# Patient Record
Sex: Male | Born: 1935 | Race: White | Hispanic: No | Smoking: Former smoker
Health system: Southern US, Community
[De-identification: ages and names within clinical notes are randomized; demographics above are authoritative.]

## PROBLEM LIST (undated history)

## (undated) DIAGNOSIS — J189 Pneumonia, unspecified organism: Secondary | ICD-10-CM

## (undated) DIAGNOSIS — R7303 Prediabetes: Secondary | ICD-10-CM

## (undated) DIAGNOSIS — K922 Gastrointestinal hemorrhage, unspecified: Secondary | ICD-10-CM

## (undated) DIAGNOSIS — I2510 Atherosclerotic heart disease of native coronary artery without angina pectoris: Secondary | ICD-10-CM

## (undated) DIAGNOSIS — A159 Respiratory tuberculosis unspecified: Secondary | ICD-10-CM

## (undated) DIAGNOSIS — M7551 Bursitis of right shoulder: Secondary | ICD-10-CM

## (undated) DIAGNOSIS — I10 Essential (primary) hypertension: Secondary | ICD-10-CM

## (undated) DIAGNOSIS — D649 Anemia, unspecified: Secondary | ICD-10-CM

## (undated) DIAGNOSIS — Z91199 Patient's noncompliance with other medical treatment and regimen due to unspecified reason: Secondary | ICD-10-CM

## (undated) DIAGNOSIS — E785 Hyperlipidemia, unspecified: Secondary | ICD-10-CM

## (undated) DIAGNOSIS — Z9119 Patient's noncompliance with other medical treatment and regimen: Secondary | ICD-10-CM

## (undated) DIAGNOSIS — M7552 Bursitis of left shoulder: Secondary | ICD-10-CM

## (undated) HISTORY — PX: UPPER GI ENDOSCOPY: SHX6162

## (undated) HISTORY — PX: TONSILLECTOMY: SUR1361

## (undated) HISTORY — PX: CORONARY ANGIOPLASTY WITH STENT PLACEMENT: SHX49

## (undated) HISTORY — PX: CATARACT EXTRACTION W/ INTRAOCULAR LENS  IMPLANT, BILATERAL: SHX1307

---

## 2007-09-08 ENCOUNTER — Ambulatory Visit: Payer: Self-pay | Admitting: Ophthalmology

## 2007-09-08 ENCOUNTER — Other Ambulatory Visit: Payer: Self-pay

## 2007-09-09 ENCOUNTER — Ambulatory Visit: Payer: Self-pay | Admitting: Cardiology

## 2007-09-14 ENCOUNTER — Ambulatory Visit: Payer: Self-pay | Admitting: Ophthalmology

## 2007-09-30 ENCOUNTER — Ambulatory Visit: Payer: Self-pay | Admitting: Ophthalmology

## 2007-10-05 ENCOUNTER — Ambulatory Visit: Payer: Self-pay | Admitting: Ophthalmology

## 2013-10-28 DIAGNOSIS — K922 Gastrointestinal hemorrhage, unspecified: Secondary | ICD-10-CM

## 2013-10-28 HISTORY — DX: Gastrointestinal hemorrhage, unspecified: K92.2

## 2015-03-24 ENCOUNTER — Other Ambulatory Visit: Payer: Self-pay

## 2015-03-24 ENCOUNTER — Encounter: Payer: Self-pay | Admitting: Emergency Medicine

## 2015-03-24 ENCOUNTER — Emergency Department: Payer: Medicare Other

## 2015-03-24 ENCOUNTER — Emergency Department
Admission: EM | Admit: 2015-03-24 | Discharge: 2015-03-24 | Disposition: A | Discharge status: Home / Self Care | Payer: Medicare Other | Attending: Emergency Medicine | Admitting: Emergency Medicine

## 2015-03-24 DIAGNOSIS — Z87891 Personal history of nicotine dependence: Secondary | ICD-10-CM | POA: Insufficient documentation

## 2015-03-24 DIAGNOSIS — R42 Dizziness and giddiness: Secondary | ICD-10-CM | POA: Diagnosis present

## 2015-03-24 DIAGNOSIS — Z880 Allergy status to penicillin: Secondary | ICD-10-CM | POA: Insufficient documentation

## 2015-03-24 DIAGNOSIS — Z79899 Other long term (current) drug therapy: Secondary | ICD-10-CM | POA: Insufficient documentation

## 2015-03-24 DIAGNOSIS — Z7982 Long term (current) use of aspirin: Secondary | ICD-10-CM | POA: Diagnosis not present

## 2015-03-24 DIAGNOSIS — Z7951 Long term (current) use of inhaled steroids: Secondary | ICD-10-CM | POA: Diagnosis not present

## 2015-03-24 LAB — CBC
HEMATOCRIT: 33.6 % — AB (ref 40.0–52.0)
Hemoglobin: 10.3 g/dL — ABNORMAL LOW (ref 13.0–18.0)
MCH: 22.9 pg — ABNORMAL LOW (ref 26.0–34.0)
MCHC: 30.6 g/dL — AB (ref 32.0–36.0)
MCV: 74.8 fL — ABNORMAL LOW (ref 80.0–100.0)
Platelets: 414 10*3/uL (ref 150–440)
RBC: 4.49 MIL/uL (ref 4.40–5.90)
RDW: 23.8 % — ABNORMAL HIGH (ref 11.5–14.5)
WBC: 7.3 10*3/uL (ref 3.8–10.6)

## 2015-03-24 LAB — BASIC METABOLIC PANEL
ANION GAP: 8 (ref 5–15)
BUN: 12 mg/dL (ref 6–20)
CALCIUM: 8.7 mg/dL — AB (ref 8.9–10.3)
CO2: 26 mmol/L (ref 22–32)
Chloride: 104 mmol/L (ref 101–111)
Creatinine, Ser: 0.98 mg/dL (ref 0.61–1.24)
GFR calc non Af Amer: >60 mL/min (ref >60)
Glucose, Bld: 123 mg/dL — ABNORMAL HIGH (ref 65–99)
Potassium: 3.1 mmol/L — ABNORMAL LOW (ref 3.5–5.1)
SODIUM: 138 mmol/L (ref 135–145)

## 2015-03-24 LAB — TROPONIN I: Troponin I: <0.03 ng/mL (ref <0.031)

## 2015-03-24 MED ORDER — MECLIZINE HCL 25 MG PO TABS
25.0000 mg | ORAL_TABLET | Freq: Once | ORAL | Status: AC
  Administered 2015-03-24: 25 mg via ORAL

## 2015-03-24 MED ORDER — MECLIZINE HCL 12.5 MG PO TABS: 12.5000 mg | ORAL_TABLET | Freq: Three times a day (TID) | ORAL | Status: AC | PRN

## 2015-03-24 MED ORDER — MECLIZINE HCL 25 MG PO TABS
ORAL_TABLET | ORAL | Status: AC
  Administered 2015-03-24: 25 mg via ORAL
  Filled 2015-03-24: qty 1

## 2015-03-24 MED ORDER — SODIUM CHLORIDE 0.9 % IV BOLUS (SEPSIS)
500.0000 mL | Freq: Once | INTRAVENOUS | Status: AC
  Administered 2015-03-24: 500 mL via INTRAVENOUS

## 2015-03-24 NOTE — ED Notes (Signed)
Attempt made to insert IV, patient refused to allow me to try again.

## 2015-03-24 NOTE — ED Notes (Signed)
Patient to ER for dizziness and weakness that started upon waking this am.

## 2015-03-24 NOTE — ED Provider Notes (Signed)
[facility] Emergency Department Provider Note  ____________________________________________  Time seen:    I have reviewed the triage vital signs and the nursing notes.   HISTORY  Chief Complaint Dizziness and Weakness     HPI Allen Terry is a 79 y.o. male who woke at approximately 3 in the morning. He got up to the bathroom and felt off balance and had difficulty walking. He returned to bed but found that he had further feelings of being off balance upon waking this morning. He feels the room is shifting or spinning distal little bit. He feels off balance. His symptoms are moderate.  He does not have any headache, chest pain, or shortness of breath. He has no focal weakness. He does report having vertigo in the past. This is mostly similar although the patient is concerned that he might be having a stroke.  He does have a notable cardiac history with 2 stents in his coronary arteries.  Patient does report that he has a bleeding ulcer that he is being treated for. He had a recent drop in his blood count and is currently on iron pills. He does have dark stools.   History reviewed. No pertinent past medical history.  There are no active problems to display for this patient.   Past Surgical History  Procedure Laterality Date  . Cardiac surgery      Stent    Current Outpatient Rx  Name  Route  Sig  Dispense  Refill  . aspirin EC 81 MG tablet   Oral   Take 81 mg by mouth daily.         . ferrous sulfate 325 (65 FE) MG tablet   Oral   Take 325 mg by mouth 2 (two) times daily.         . finasteride (PROSCAR) 5 MG tablet   Oral   Take 5 mg by mouth daily.         . fluticasone (FLONASE) 50 MCG/ACT nasal spray   Each Nare   Place 1 spray into both nostrils daily.         Marland Kitchen gabapentin (NEURONTIN) 300 MG capsule   Oral   Take 300 mg by mouth daily.         . hydrochlorothiazide (HYDRODIURIL) 25 MG tablet   Oral   Take 25 mg by  mouth daily.         Marland Kitchen omeprazole (PRILOSEC OTC) 20 MG tablet   Oral   Take 20 mg by mouth 2 (two) times daily.         . prasugrel (EFFIENT) 10 MG TABS tablet   Oral   Take 10 mg by mouth daily.         . tamsulosin (FLOMAX) 0.4 MG CAPS capsule   Oral   Take 0.4 mg by mouth daily.         . meclizine (ANTIVERT) 12.5 MG tablet   Oral   Take 1 tablet (12.5 mg total) by mouth 3 (three) times daily as needed for dizziness or nausea.   16 tablet   1     Allergies Penicillins  No family history on file.  Social History History  Substance Use Topics  . Smoking status: Former Games developer  . Smokeless tobacco: Not on file  . Alcohol Use: No    Review of Systems  Constitutional: Negative for fever. ENT: Negative for sore throat. Cardiovascular: Negative for chest pain. Respiratory: Negative for shortness of breath. Gastrointestinal:  Negative for abdominal pain, vomiting and diarrhea. Notable for history of bleeding also. The patient recently had a skin been drop is his hemoglobin level and is now on iron pills. Patient reports he has dark stools. Genitourinary: Negative for dysuria. Musculoskeletal: Negative for back pain. Skin: Negative for rash. Neurological: Notable for vertigo. See history of present illness  10-point ROS otherwise negative.  ____________________________________________   PHYSICAL EXAM:  VITAL SIGNS: ED Triage Vitals  Enc Vitals Group     BP 03/24/15 1113 129/64 mmHg     Pulse Rate 03/24/15 1113 64     Resp 03/24/15 1113 18     Temp 03/24/15 1113 97.5 F (36.4 C)     Temp Source 03/24/15 1113 Oral     SpO2 03/24/15 1113 96 %     Weight 03/24/15 1113 200 lb (90.719 kg)     Height 03/24/15 1113 5\' 8"  (1.727 m)     Head Cir --      Peak Flow --      Pain Score 03/24/15 1110 5     Pain Loc --      Pain Edu? --      Excl. in GC? --     Constitutional:  Alert and oriented. Well appearing and in no distress. Notable tremor.Marland Kitchen. ENT    Head: Normocephalic and atraumatic.   Nose: No congestion/rhinnorhea.   Mouth/Throat: Mucous membranes are moist.      Ears: Normal canals, normal TMs. Cardiovascular: Normal rate, regular rhythm. Respiratory: Normal respiratory effort without tachypnea. Breath sounds are clear and equal bilaterally. No wheezes/rales/rhonchi. Gastrointestinal: Soft and nontender. No distention.  Back: No muscle spasm, no tenderness, no CVA tenderness. Musculoskeletal: Nontender with normal range of motion in all extremities.  No noted edema. Neurologic: Alert and oriented. 5 over 5 strength in all 4 extremities. Equal grip strength. Good finger to nose and heel to shin test. Negative pronator drift. Romberg is done with the patient seated in bed but was negative. Cranial nerves II through XII are intact..  Skin:  Skin is warm, dry. No rash noted. Psychiatric: Mood and affect are normal. Speech and behavior are normal.  ____________________________________________    LABS (pertinent positives/negatives)  CBC 7.3 hemoglobin of 10.3 which is better than the patient's last reading per the patient. Metabolic panel shows a slightly low potassium at 3.1 otherwise normal. Troponin is negative  ____________________________________________   EKG  ED ECG REPORT I, Tranice Laduke W, the attending physician, personally viewed and interpreted this ECG.   Date: 03/24/2015  EKG Time: 11:30  Rate: 65  Rhythm: Normal sinus rhythm  Axis: Normal  Intervals: Normal  ST&T Change: None   ____________________________________________    RADIOLOGY  CT head: IMPRESSION: Mild atrophic changes. No acute abnormality noted.  ____________________________________________  INITIAL IMPRESSION / ASSESSMENT AND PLAN / ED COURSE  Patient has a mild tremor which is not new. I believe he has peripheral vertigo. Neurologically he appears intact. We will do a head CT. I will treat the likely peripheral vertigo with  meclizine.  ----------------------------------------- 5:03 PM on 03/24/2015 -----------------------------------------   Patient feels much better. He is now able to stand up and walk. He no longer has acute symptoms. His head CT was unremarkable except for expected age-related changes. I will prescribe meclizine  ____________________________________________   FINAL CLINICAL IMPRESSION(S) / ED DIAGNOSES  Final diagnoses:  Vertigo      Darien Ramusavid W Zenaida Tesar, MD 03/24/15 [phone]

## 2015-03-24 NOTE — Discharge Instructions (Signed)

## 2016-02-12 ENCOUNTER — Emergency Department (HOSPITAL_COMMUNITY): Payer: Medicare Other

## 2016-02-12 ENCOUNTER — Inpatient Hospital Stay (HOSPITAL_COMMUNITY)
Admission: EM | Admit: 2016-02-12 | Discharge: 2016-02-14 | DRG: 247 | Disposition: A | Discharge status: Home / Self Care | Payer: Medicare Other | Attending: Cardiovascular Disease | Admitting: Cardiovascular Disease

## 2016-02-12 ENCOUNTER — Encounter (HOSPITAL_COMMUNITY): Payer: Self-pay | Admitting: Emergency Medicine

## 2016-02-12 DIAGNOSIS — I25110 Atherosclerotic heart disease of native coronary artery with unstable angina pectoris: Principal | ICD-10-CM

## 2016-02-12 DIAGNOSIS — I200 Unstable angina: Secondary | ICD-10-CM | POA: Diagnosis not present

## 2016-02-12 DIAGNOSIS — I2510 Atherosclerotic heart disease of native coronary artery without angina pectoris: Secondary | ICD-10-CM | POA: Insufficient documentation

## 2016-02-12 DIAGNOSIS — I10 Essential (primary) hypertension: Secondary | ICD-10-CM | POA: Diagnosis not present

## 2016-02-12 DIAGNOSIS — Z7982 Long term (current) use of aspirin: Secondary | ICD-10-CM

## 2016-02-12 DIAGNOSIS — R0789 Other chest pain: Secondary | ICD-10-CM | POA: Diagnosis present

## 2016-02-12 DIAGNOSIS — E785 Hyperlipidemia, unspecified: Secondary | ICD-10-CM | POA: Diagnosis not present

## 2016-02-12 DIAGNOSIS — Z880 Allergy status to penicillin: Secondary | ICD-10-CM

## 2016-02-12 DIAGNOSIS — Z955 Presence of coronary angioplasty implant and graft: Secondary | ICD-10-CM

## 2016-02-12 DIAGNOSIS — Z79899 Other long term (current) drug therapy: Secondary | ICD-10-CM

## 2016-02-12 DIAGNOSIS — R0609 Other forms of dyspnea: Secondary | ICD-10-CM

## 2016-02-12 DIAGNOSIS — Z888 Allergy status to other drugs, medicaments and biological substances status: Secondary | ICD-10-CM

## 2016-02-12 DIAGNOSIS — Z9114 Patient's other noncompliance with medication regimen: Secondary | ICD-10-CM

## 2016-02-12 HISTORY — DX: Gastrointestinal hemorrhage, unspecified: K92.2

## 2016-02-12 HISTORY — DX: Anemia, unspecified: D64.9

## 2016-02-12 HISTORY — DX: Bursitis of left shoulder: M75.52

## 2016-02-12 HISTORY — DX: Hyperlipidemia, unspecified: E78.5

## 2016-02-12 HISTORY — DX: Bursitis of right shoulder: M75.51

## 2016-02-12 HISTORY — DX: Atherosclerotic heart disease of native coronary artery without angina pectoris: I25.10

## 2016-02-12 HISTORY — DX: Pneumonia, unspecified organism: J18.9

## 2016-02-12 HISTORY — DX: Essential (primary) hypertension: I10

## 2016-02-12 HISTORY — DX: Respiratory tuberculosis unspecified: A15.9

## 2016-02-12 LAB — BASIC METABOLIC PANEL
ANION GAP: 11 (ref 5–15)
BUN: 7 mg/dL (ref 6–20)
CALCIUM: 9.3 mg/dL (ref 8.9–10.3)
CO2: 24 mmol/L (ref 22–32)
CREATININE: 1.09 mg/dL (ref 0.61–1.24)
Chloride: 103 mmol/L (ref 101–111)
Glucose, Bld: 122 mg/dL — ABNORMAL HIGH (ref 65–99)
Potassium: 3.4 mmol/L — ABNORMAL LOW (ref 3.5–5.1)
SODIUM: 138 mmol/L (ref 135–145)

## 2016-02-12 LAB — CBC
HCT: 44.3 % (ref 39.0–52.0)
Hemoglobin: 14.6 g/dL (ref 13.0–17.0)
MCH: 27.5 pg (ref 26.0–34.0)
MCHC: 33 g/dL (ref 30.0–36.0)
MCV: 83.6 fL (ref 78.0–100.0)
PLATELETS: 295 10*3/uL (ref 150–400)
RBC: 5.3 MIL/uL (ref 4.22–5.81)
RDW: 14.1 % (ref 11.5–15.5)
WBC: 8.6 10*3/uL (ref 4.0–10.5)

## 2016-02-12 LAB — BRAIN NATRIURETIC PEPTIDE: B Natriuretic Peptide: 154.1 pg/mL — ABNORMAL HIGH (ref 0.0–100.0)

## 2016-02-12 LAB — PROTIME-INR
INR: 1.12 (ref 0.00–1.49)
Prothrombin Time: 14.6 seconds (ref 11.6–15.2)

## 2016-02-12 LAB — I-STAT TROPONIN, ED: TROPONIN I, POC: 0.01 ng/mL (ref 0.00–0.08)

## 2016-02-12 LAB — TROPONIN I: Troponin I: <0.03 ng/mL (ref <0.031)

## 2016-02-12 MED ORDER — SODIUM CHLORIDE 0.9 % WEIGHT BASED INFUSION
1.0000 mL/kg/h | INTRAVENOUS | Status: DC
  Administered 2016-02-13: 1 mL/kg/h via INTRAVENOUS

## 2016-02-12 MED ORDER — SODIUM CHLORIDE 0.9% FLUSH: 3.0000 mL | INTRAVENOUS | Status: DC | PRN

## 2016-02-12 MED ORDER — MECLIZINE HCL 12.5 MG PO TABS
12.5000 mg | ORAL_TABLET | Freq: Three times a day (TID) | ORAL | Status: DC | PRN
  Filled 2016-02-12: qty 1

## 2016-02-12 MED ORDER — ACETAMINOPHEN 325 MG PO TABS: 650.0000 mg | ORAL_TABLET | ORAL | Status: DC | PRN

## 2016-02-12 MED ORDER — TAMSULOSIN HCL 0.4 MG PO CAPS
0.4000 mg | ORAL_CAPSULE | Freq: Every day | ORAL | Status: DC
  Administered 2016-02-13 – 2016-02-14 (×2): 0.4 mg via ORAL
  Filled 2016-02-12 (×2): qty 1

## 2016-02-12 MED ORDER — HEPARIN BOLUS VIA INFUSION
4000.0000 [IU] | Freq: Once | INTRAVENOUS | Status: AC
  Administered 2016-02-12: 4000 [IU] via INTRAVENOUS
  Filled 2016-02-12: qty 4000

## 2016-02-12 MED ORDER — NITROGLYCERIN 0.4 MG SL SUBL: 0.4000 mg | SUBLINGUAL_TABLET | SUBLINGUAL | Status: DC | PRN

## 2016-02-12 MED ORDER — ASPIRIN EC 81 MG PO TBEC
81.0000 mg | DELAYED_RELEASE_TABLET | Freq: Every evening | ORAL | Status: DC
  Administered 2016-02-12 – 2016-02-14 (×2): 81 mg via ORAL
  Filled 2016-02-12 (×2): qty 1

## 2016-02-12 MED ORDER — SODIUM CHLORIDE 0.9% FLUSH
3.0000 mL | Freq: Two times a day (BID) | INTRAVENOUS | Status: DC
  Administered 2016-02-12: 3 mL via INTRAVENOUS

## 2016-02-12 MED ORDER — HEPARIN (PORCINE) IN NACL 100-0.45 UNIT/ML-% IJ SOLN
1150.0000 [IU]/h | INTRAMUSCULAR | Status: DC
  Administered 2016-02-12: 1150 [IU]/h via INTRAVENOUS
  Filled 2016-02-12: qty 250

## 2016-02-12 MED ORDER — ASPIRIN 81 MG PO CHEW
81.0000 mg | CHEWABLE_TABLET | ORAL | Status: AC
  Administered 2016-02-13: 81 mg via ORAL
  Filled 2016-02-12: qty 1

## 2016-02-12 MED ORDER — SODIUM CHLORIDE 0.9 % WEIGHT BASED INFUSION
3.0000 mL/kg/h | INTRAVENOUS | Status: DC
  Administered 2016-02-13: 3 mL/kg/h via INTRAVENOUS

## 2016-02-12 MED ORDER — ONDANSETRON HCL 4 MG/2ML IJ SOLN: 4.0000 mg | Freq: Four times a day (QID) | INTRAMUSCULAR | Status: DC | PRN

## 2016-02-12 MED ORDER — SODIUM CHLORIDE 0.9 % IV SOLN: 250.0000 mL | INTRAVENOUS | Status: DC | PRN

## 2016-02-12 NOTE — ED Notes (Signed)
Ordered diet tray 

## 2016-02-12 NOTE — ED Provider Notes (Signed)
Patient signed out to me at shift change by Gaylyn RongSamantha Dowless, PA-C.  [age] year old male with a PMH of CAD s/p stent placement presents with chest pressure and progressively worsening dyspnea on exertion x 2 weeks. States he has not been able to tolerate his usual activity due to this. Reports this is how he felt in the past when he had blockages.   Patient is afebrile. Vital signs stable.   EKG Interpretation  Date/Time:  Monday February 12 2016 12:28:54 EDT Ventricular Rate:  80 PR Interval:  150 QRS Duration: 88 QT Interval:  380 QTC Calculation: 438 R Axis:   52 Text Interpretation:  Normal sinus rhythm Nonspecific ST abnormality Abnormal ECG Confirmed by Fayrene FearingJAMES  MD, MARK ([id number]) on 02/12/2016 2:33:08 PM      EKG sinus rhythm, nonspecific ST abnormality, HR 80. Troponin negative. CBC negative for leukocytosis or anemia. BMP remarkable for potassium 3.4. INR 1.12. CXR negative for active cardiopulmonary disease.  Cardiology consulted. Plan to follow-up recommendation. Per cardiology note, patient to be admitted and will undergo cardiac cath in the AM.  BP 111/54 mmHg  Pulse 70  Temp(Src) 98 F (36.7 C) (Oral)  Resp 16  SpO2 93%    Mady Gemmalizabeth C Westfall, PA-C 02/12/16 1802  Rolland PorterMark James, MD 02/20/16 1456

## 2016-02-12 NOTE — ED Provider Notes (Signed)
CSN: [id number]     Arrival date & time 02/12/16  1210 History   First MD Initiated Contact with Patient 02/12/16 1416     Chief Complaint  Patient presents with  . Chest Pain     (Consider location/radiation/quality/duration/timing/severity/associated sxs/prior Treatment) HPI   Allen Terry is a 80 y.o M with a pmhx of CAD s/p multiple stent placements, HTN, HLD who presents to the ED c/o chest pressure and DOE. Pt states that he is typically walks 2 miles daily but over the last 2 weeks he has only been able to walk 1-2 blocks without becoming very short of breath. Pt states that this morning he could barely walk to the bathroom. He also reports that his chest "feels full". Pt states "I know I have blockage and I want a catheterization." Pt states that this is how he felt when he had his last blockages. Pt denies dizziness, diaphoresis, LOC, abd pain. [facility] and had his stents placed there.    History reviewed. No pertinent past medical history. Past Surgical History  Procedure Laterality Date  . Cardiac surgery      Stent  . Stents     No family history on file. Social History  Substance Use Topics  . Smoking status: Former Games developermoker  . Smokeless tobacco: None  . Alcohol Use: No    Review of Systems  All other systems reviewed and are negative.     Allergies  Penicillins  Home Medications   Prior to Admission medications   Medication Sig Start Date End Date Taking? Authorizing Provider  aspirin EC 81 MG tablet Take 81 mg by mouth daily.    Historical Provider, MD  ferrous sulfate 325 (65 FE) MG tablet Take 325 mg by mouth 2 (two) times daily.    Historical Provider, MD  finasteride (PROSCAR) 5 MG tablet Take 5 mg by mouth daily.    Historical Provider, MD  fluticasone (FLONASE) 50 MCG/ACT nasal spray Place 1 spray into both nostrils daily.    Historical Provider, MD  gabapentin (NEURONTIN) 300 MG capsule Take 300 mg by mouth  daily.    Historical Provider, MD  hydrochlorothiazide (HYDRODIURIL) 25 MG tablet Take 25 mg by mouth daily.    Historical Provider, MD  meclizine (ANTIVERT) 12.5 MG tablet Take 1 tablet (12.5 mg total) by mouth 3 (three) times daily as needed for dizziness or nausea. 03/24/15 03/23/16  Darien Ramusavid W Kaminski, MD  omeprazole (PRILOSEC OTC) 20 MG tablet Take 20 mg by mouth 2 (two) times daily.    Historical Provider, MD  prasugrel (EFFIENT) 10 MG TABS tablet Take 10 mg by mouth daily.    Historical Provider, MD  tamsulosin (FLOMAX) 0.4 MG CAPS capsule Take 0.4 mg by mouth daily.    Historical Provider, MD   BP 165/72 mmHg  Pulse 67  Temp(Src) 98 F (36.7 C) (Oral)  Resp 17  SpO2 97% Physical Exam  Constitutional: He is oriented to person, place, and time. He appears well-developed and well-nourished. No distress.  HENT:  Head: Normocephalic and atraumatic.  Mouth/Throat: No oropharyngeal exudate.  Eyes: Conjunctivae and EOM are normal. Pupils are equal, round, and reactive to light. Right eye exhibits no discharge. Left eye exhibits no discharge. No scleral icterus.  Cardiovascular: Normal rate, regular rhythm, normal heart sounds and intact distal pulses.  Exam reveals no gallop and no friction rub.   No murmur heard. Pulmonary/Chest: Effort normal and breath sounds normal. No  respiratory distress. He has no wheezes. He has no rales. He exhibits no tenderness.  Abdominal: Soft. He exhibits no distension. There is no tenderness. There is no guarding.  Musculoskeletal: Normal range of motion. He exhibits no edema.  Neurological: He is alert and oriented to person, place, and time.  Strength 5/5 throughout. No sensory deficits.    Skin: Skin is warm and dry. No rash noted. He is not diaphoretic. No erythema. No pallor.  Psychiatric: He has a normal mood and affect. His behavior is normal.  Nursing note and vitals reviewed.   ED Course  Procedures (including critical care time) Labs  Review Labs Reviewed  BASIC METABOLIC PANEL - Abnormal; Notable for the following:    Potassium 3.4 (*)    Glucose, Bld 122 (*)    All other components within normal limits  CBC  PROTIME-INR  BRAIN NATRIURETIC PEPTIDE  I-STAT TROPOININ, ED    Imaging Review Dg Chest 2 View  02/12/2016  CLINICAL DATA:  Chest pain and shortness of breath. EXAM: CHEST  2 VIEW COMPARISON:  None. FINDINGS: The heart size and mediastinal contours are within normal limits. Both lungs are clear. The visualized skeletal structures are unremarkable. IMPRESSION: No active cardiopulmonary disease. Electronically Signed   By: Francene Boyers M.D.   On: 02/12/2016 13:05   I have personally reviewed and evaluated these images and lab results as part of my medical decision-making.   EKG Interpretation   Date/Time:  Monday February 12 2016 12:28:54 EDT Ventricular Rate:  80 PR Interval:  150 QRS Duration: 88 QT Interval:  380 QTC Calculation: 438 R Axis:   52 Text Interpretation:  Normal sinus rhythm Nonspecific ST abnormality  Abnormal ECG Confirmed by Fayrene Fearing  MD, MARK ([id number]) on 02/12/2016 2:33:08 PM      MDM   Final diagnoses:  DOE (dyspnea on exertion)    80 y.o M with HTN, CAD, HLD presents to the ED c/o DOE onset 2 weeks ago. This is drastically different from his baseline. Normally able to walk 2 miles daily, this morning was unable to walk to bathroom. On presentation to ED, pt appears well, in NAD. Pt states that this feels like the last time he had a blockage requiring stent placement. EKG reveals nonspecific T wave abnormality. Initial troponin normal. All other blood work wnl. CXR negative. Pts cardiologist is in [place]. Will consult cardiology as pt is requesting cardiac catheterization. Pt signed out to Olive Bass PA-C at shift change pending cariology consult. Will dispo based on their recommendations. Pt is currently hemodynamically stable.   Patient was discussed with and seen by Dr. Fayrene Fearing  who agrees with the treatment plan.      Lester Kinsman Shirley, PA-C 02/12/16 1634  Rolland Porter, MD 02/15/16 [phone]

## 2016-02-12 NOTE — H&P (Signed)
I have seen and examined the patient along with Laverda PageLindsay Roberts, NP.  I have reviewed the chart, notes and new data.  I agree with NP's note.  Key new complaints: describes a compelling pattern of unstable/crescendo angina that has progressed fairly rapidly over last 2 weeks, now CCS class3 and commensurate exertional dyspnea, just walking in the house. Symptoms are identical to previous presentation. Key examination changes: no overt signs of HF, no arrhythmia, excellent distal pulses Key new findings / data: subtle ST depression lead 2, normal enzymes  PLAN: Cardiac cath in AM, possible PCI. This procedure has been fully reviewed with the patient and written informed consent has been obtained. He is convinced Brilinta made him bleed and prefers Effient. Has genotype for poor clopidogrel responder. We need to identify a lipid lowering regimen for him. He has had myalgia with multiple statins and does not want to try again. Investigate affordability of PCSK9 inhibitors.  Thurmon FairMihai Irisa Grimsley, MD, Outpatient CarecenterFACC [facility] [phone] 02/12/2016, 5:37 PM

## 2016-02-12 NOTE — ED Notes (Signed)
Pt has hx of stents, has had increasing fullness and shortness of breath over past week-- had been walking 2 miles a day, has only been able to walk about 2 laps in past few days.

## 2016-02-12 NOTE — H&P (Signed)
History & Physical    Patient ID: Allen Terry MRN: [id number], DOB/AGE: 78-07-1936   Admit date: 02/12/2016   Primary Physician: No primary care provider on file. Primary Cardiologist: Mr. Willa Rough  Patient Profile    80 yo male with PMH of CAD s/p PCI 01/17/2011 85% proximal RCA with DES, PCI 09/05/2014 60% mid LAD lesion, with DES to LAD/HTN/HLD presenting to the [facility] ED with chest pressure and DOE.  Past Medical History    Past Medical History  Diagnosis Date  . Hyperlipidemia   . HTN (hypertension)   . CAD (coronary artery disease)     s/p PCI 01/17/2011 85% proximal RCA with DES, PCI 09/05/2014 60% mid LAD lesion, with DES     Past Surgical History  Procedure Laterality Date  . Cardiac surgery      s/p PCI 01/17/2011 85% proximal RCA with DES, PCI 09/05/2014 60% mid LAD lesion, with DES   . Upper gi endoscopy      s/p GI bleed related to Brilenta     Allergies  Allergies  Allergen Reactions  . Ferrous Sulfate Other (See Comments)    Face burning  . Meclizine Other (See Comments)    Burning in face when taking iron  . Metoprolol Other (See Comments)    abd pain  . Pravastatin Other (See Comments)    Myalgias, even when combined with coenzyme q 10  . Ticagrelor Other (See Comments)    Cough, sneezing and nasal congestion  . Lisinopril Cough  . Penicillins Hives and Rash    History of Present Illness    80 yo male with PMH of CAD s/p PCI 01/17/2011 85% proximal RCA with DES, PCI 09/05/2014 60% mid LAD lesion, with DES to LAD/HTN/HLD presenting to the [facility] ED with chest pressure and DOE. He is a patient of Dr. Willa Rough and followed at [facility] for cardiology with last visit being 04/2015. Last Echo was 09/05/2014 which showed EF 65-70% with NWMA and no valve abnormality.   Reports over the past 2 weeks he has developed chest pressure and DOE, normally is able to walk 2 miles a day, but within the past 2 weeks he has not even been able to walk 2 blocks without having  increasing dyspnea. States he is normally very active and this has come to a point that is is interfering with his daily activities. Reports that symptoms seemed to become worse this morning with getting up to walk to the bathroom, and states he felt like he could barely walk. Pt reports he came to the ED today because he continues to have this full sensation in his chest. Reports being noncompliant with medications and stopped taking his anticoag, statin, and a majority if his other home medications.   In the ED patient had a negative chest x-ray, a negative POc trop with a normal hgb, and Cr. EKG showed NSR Rate-80 with no acute ST/Twave abnormalities. Denies currently having any chest pain, or dyspnea.   Home Medications    Prior to Admission medications   Medication Sig Start Date End Date Taking? Authorizing Provider  aspirin EC 81 MG tablet Take 81 mg by mouth every evening.    Yes Historical Provider, MD  hydrochlorothiazide (HYDRODIURIL) 25 MG tablet Take 25 mg by mouth daily.   Yes Historical Provider, MD  tamsulosin (FLOMAX) 0.4 MG CAPS capsule Take 0.4 mg by mouth daily.   Yes Historical Provider, MD  meclizine (ANTIVERT) 12.5 MG tablet Take 1  tablet (12.5 mg total) by mouth 3 (three) times daily as needed for dizziness or nausea. 03/24/15 03/23/16  Darien Ramusavid W Kaminski, MD    Family History    Family History  Problem Relation Age of Onset  . Arrhythmia Mother   . Arrhythmia Sister   . Stroke Father   . Hypertension Brother     Social History    Social History   Social History  . Marital Status: Divorced    Spouse Name: N/A  . Number of Children: N/A  . Years of Education: N/A   Occupational History  . Not on file.   Social History Main Topics  . Smoking status: Former Games developermoker  . Smokeless tobacco: Not on file  . Alcohol Use: No  . Drug Use: No  . Sexual Activity: Not on file   Other Topics Concern  . Not on file   Social History Narrative     Review of Systems      General:  No chills, fever, night sweats or weight changes.  Cardiovascular:  No chest pain, dyspnea on exertion, edema, orthopnea, palpitations, paroxysmal nocturnal dyspnea. Dermatological: No rash, lesions/masses Respiratory: No cough, dyspnea Urologic: No hematuria, dysuria Abdominal:   No nausea, vomiting, diarrhea, bright red blood per rectum, melena, or hematemesis Neurologic:  No visual changes, wkns, changes in mental status. All other systems reviewed and are otherwise negative except as noted above.  Physical Exam    Blood pressure 152/70, pulse 71, temperature 98 F (36.7 C), temperature source Oral, resp. rate 14, SpO2 99 %.  General: Pleasant, NAD Psych: Normal affect. Neuro: Alert and oriented X 3. Moves all extremities spontaneously. HEENT: Normal  Neck: Supple without bruits or JVD. Lungs:  Resp regular and unlabored, CTA. Heart: RRR no s3, s4, or murmurs. Abdomen: Soft, non-tender, non-distended, BS + x 4.  Extremities: No clubbing, cyanosis or edema. DP/PT/Radials 2+ and equal bilaterally.  Labs    Troponin [facility](Point of Care Test)  Recent Labs  02/12/16 1259  TROPIPOC 0.01   No results for input(s): CKTOTAL, CKMB, TROPONINI in the last 72 hours. Lab Results  Component Value Date   WBC 8.6 02/12/2016   HGB 14.6 02/12/2016   HCT 44.3 02/12/2016   MCV 83.6 02/12/2016   PLT 295 02/12/2016     Recent Labs Lab 02/12/16 1236  NA 138  K 3.4*  CL 103  CO2 24  BUN 7  CREATININE 1.09  CALCIUM 9.3  GLUCOSE 122*     Radiology Studies    Dg Chest 2 View  02/12/2016  CLINICAL DATA:  Chest pain and shortness of breath. EXAM: CHEST  2 VIEW COMPARISON:  None. FINDINGS: The heart size and mediastinal contours are within normal limits. Both lungs are clear. The visualized skeletal structures are unremarkable. IMPRESSION: No active cardiopulmonary disease. Electronically Signed   By: Francene BoyersJames  Maxwell M.D.   On: 02/12/2016 13:05    ECG & Cardiac Imaging     NSR Rate-80 no ST/T wave abnormalities  Assessment & Plan    1. Unstable Angina: Pt reports a 2 week hx of of chest pressure and DOE that has rapidly progressed. States that previously was more active and able to walk 2 miles a day but recently has only been able to walk a few blocks due to exertional dyspnea. States that he stopped taking his Effient because when he was previously taking [place] he developed a GI bleed. Also reports he has not been taking his statin. Considering his symptoms  and noncompliance with medications I would say he needs a cath. Spoke with Dr. Royann Shivers and will admit patient and schedule to have a Cath at 12pm with Dr. Tresa Endo. Plan to start patient in IV heparin, and cycle cardiac enzymes, with AM labs. Current Cr and Hgb is stable.   2. HLD: Reports not taking his statin, lipid panel ordered.   3. HTN: Currently on HCTZ but this was held due to plans to cath in the morning. Borderline controlled at this time. Will monitor.    Janice Coffin, NP-C Pager [phone] 02/12/2016, 5:54 PM

## 2016-02-12 NOTE — Progress Notes (Signed)
ANTICOAGULATION CONSULT NOTE - Initial Consult  Pharmacy Consult for heparin Indication: chest pain/ACS  Allergies  Allergen Reactions  . Ferrous Sulfate Other (See Comments)    Face burning  . Meclizine Other (See Comments)    Burning in face when taking iron  . Metoprolol Other (See Comments)    abd pain  . Pravastatin Other (See Comments)    Myalgias, even when combined with coenzyme q 10  . Ticagrelor Other (See Comments)    Cough, sneezing and nasal congestion  . Lisinopril Cough  . Penicillins Hives and Rash    Patient Measurements: TBW last year 91 kg IBW 68.4 kg Heparin dosing weight: 87 kg  Vital Signs: Temp: 98 F (36.7 C) (04/17 1436) Temp Source: Oral (04/17 1436) BP: 132/76 mmHg (04/17 1900) Pulse Rate: 81 (04/17 1900)  Labs:  Recent Labs  02/12/16 1236  HGB 14.6  HCT 44.3  PLT 295  LABPROT 14.6  INR 1.12  CREATININE 1.09    CrCl cannot be calculated (Unknown ideal weight.).   Medical History: Past Medical History  Diagnosis Date  . Hyperlipidemia   . HTN (hypertension)   . CAD (coronary artery disease)     s/p PCI 01/17/2011 85% proximal RCA with DES, PCI 09/05/2014 60% mid LAD lesion, with DES     Assessment: 80 yo M presents on 4/17 with chest pressure and DOE. Has extensive cardiac history. Pharmacy consulted to start heparin for ACS. No anticoag PTA. CBC stable. No s/s of bleed.  Goal of Therapy:  Heparin level 0.3-0.7 units/ml Monitor platelets by anticoagulation protocol: Yes   Plan:  Give 4,000 unit heparin bolus Start heparin gtt at 1,150 units/hr Monitor daily HL, CBC, s/s of bleed Check 8 hr HL  Enzo BiNathan Aubre Quincy, PharmD, [facility] Clinical Pharmacist Pager [phone] 02/12/2016 8:59 PM

## 2016-02-13 ENCOUNTER — Encounter (HOSPITAL_COMMUNITY): Payer: Self-pay | Admitting: Cardiovascular Disease

## 2016-02-13 ENCOUNTER — Encounter (HOSPITAL_COMMUNITY)
Admission: EM | Disposition: A | Discharge status: Home / Self Care | Payer: Self-pay | Source: Home / Self Care | Attending: Cardiovascular Disease

## 2016-02-13 DIAGNOSIS — R0609 Other forms of dyspnea: Secondary | ICD-10-CM | POA: Insufficient documentation

## 2016-02-13 DIAGNOSIS — I200 Unstable angina: Secondary | ICD-10-CM | POA: Diagnosis not present

## 2016-02-13 DIAGNOSIS — Z9114 Patient's other noncompliance with medication regimen: Secondary | ICD-10-CM | POA: Diagnosis not present

## 2016-02-13 DIAGNOSIS — I10 Essential (primary) hypertension: Secondary | ICD-10-CM | POA: Diagnosis present

## 2016-02-13 DIAGNOSIS — I25110 Atherosclerotic heart disease of native coronary artery with unstable angina pectoris: Secondary | ICD-10-CM | POA: Diagnosis present

## 2016-02-13 DIAGNOSIS — Z880 Allergy status to penicillin: Secondary | ICD-10-CM | POA: Diagnosis not present

## 2016-02-13 DIAGNOSIS — Z955 Presence of coronary angioplasty implant and graft: Secondary | ICD-10-CM | POA: Diagnosis not present

## 2016-02-13 DIAGNOSIS — Z888 Allergy status to other drugs, medicaments and biological substances status: Secondary | ICD-10-CM | POA: Diagnosis not present

## 2016-02-13 DIAGNOSIS — I2510 Atherosclerotic heart disease of native coronary artery without angina pectoris: Secondary | ICD-10-CM | POA: Diagnosis not present

## 2016-02-13 DIAGNOSIS — Z7982 Long term (current) use of aspirin: Secondary | ICD-10-CM | POA: Diagnosis not present

## 2016-02-13 DIAGNOSIS — Z79899 Other long term (current) drug therapy: Secondary | ICD-10-CM | POA: Diagnosis not present

## 2016-02-13 DIAGNOSIS — E785 Hyperlipidemia, unspecified: Secondary | ICD-10-CM | POA: Diagnosis present

## 2016-02-13 HISTORY — PX: CARDIAC CATHETERIZATION: SHX172

## 2016-02-13 LAB — BASIC METABOLIC PANEL
ANION GAP: 10 (ref 5–15)
BUN: 7 mg/dL (ref 6–20)
CO2: 26 mmol/L (ref 22–32)
Calcium: 8.8 mg/dL — ABNORMAL LOW (ref 8.9–10.3)
Chloride: 104 mmol/L (ref 101–111)
Creatinine, Ser: 1.07 mg/dL (ref 0.61–1.24)
GFR calc Af Amer: >60 mL/min (ref >60)
GLUCOSE: 129 mg/dL — AB (ref 65–99)
POTASSIUM: 3.5 mmol/L (ref 3.5–5.1)
Sodium: 140 mmol/L (ref 135–145)

## 2016-02-13 LAB — CBC
HCT: 43.8 % (ref 39.0–52.0)
Hemoglobin: 14 g/dL (ref 13.0–17.0)
MCH: 26.8 pg (ref 26.0–34.0)
MCHC: 32 g/dL (ref 30.0–36.0)
MCV: 83.7 fL (ref 78.0–100.0)
PLATELETS: 262 10*3/uL (ref 150–400)
RBC: 5.23 MIL/uL (ref 4.22–5.81)
RDW: 14.2 % (ref 11.5–15.5)
WBC: 8.6 10*3/uL (ref 4.0–10.5)

## 2016-02-13 LAB — PROTIME-INR
INR: 1.21 (ref 0.00–1.49)
Prothrombin Time: 15.5 seconds — ABNORMAL HIGH (ref 11.6–15.2)

## 2016-02-13 LAB — LIPID PANEL
CHOL/HDL RATIO: 7.6 ratio
Cholesterol: 151 mg/dL (ref 0–200)
HDL: 20 mg/dL — AB (ref >40)
LDL Cholesterol: 91 mg/dL (ref 0–99)
TRIGLYCERIDES: 199 mg/dL — AB (ref <150)
VLDL: 40 mg/dL (ref 0–40)

## 2016-02-13 LAB — POCT ACTIVATED CLOTTING TIME: ACTIVATED CLOTTING TIME: 528 s

## 2016-02-13 LAB — HEPARIN LEVEL (UNFRACTIONATED): HEPARIN UNFRACTIONATED: 0.4 [IU]/mL (ref 0.30–0.70)

## 2016-02-13 LAB — TROPONIN I
TROPONIN I: 0.03 ng/mL (ref <0.031)
Troponin I: <0.03 ng/mL (ref <0.031)

## 2016-02-13 SURGERY — LEFT HEART CATH AND CORONARY ANGIOGRAPHY

## 2016-02-13 MED ORDER — HEPARIN (PORCINE) IN NACL 2-0.9 UNIT/ML-% IJ SOLN
INTRAMUSCULAR | Status: DC | PRN
  Administered 2016-02-13: 1500 mL

## 2016-02-13 MED ORDER — PRASUGREL HCL 10 MG PO TABS
ORAL_TABLET | ORAL | Status: AC
  Filled 2016-02-13: qty 1

## 2016-02-13 MED ORDER — SODIUM CHLORIDE 0.9% FLUSH: 3.0000 mL | Freq: Two times a day (BID) | INTRAVENOUS | Status: DC

## 2016-02-13 MED ORDER — PRASUGREL HCL 10 MG PO TABS
10.0000 mg | ORAL_TABLET | Freq: Every day | ORAL | Status: DC
  Administered 2016-02-14: 10:00:00 10 mg via ORAL
  Filled 2016-02-13: qty 1

## 2016-02-13 MED ORDER — HEPARIN SODIUM (PORCINE) 1000 UNIT/ML IJ SOLN
INTRAMUSCULAR | Status: AC
  Filled 2016-02-13: qty 1

## 2016-02-13 MED ORDER — MIDAZOLAM HCL 2 MG/2ML IJ SOLN
INTRAMUSCULAR | Status: DC | PRN
  Administered 2016-02-13: 2 mg via INTRAVENOUS
  Administered 2016-02-13: 1 mg via INTRAVENOUS

## 2016-02-13 MED ORDER — MIDAZOLAM HCL 2 MG/2ML IJ SOLN
INTRAMUSCULAR | Status: AC
  Filled 2016-02-13: qty 2

## 2016-02-13 MED ORDER — BIVALIRUDIN 250 MG IV SOLR
INTRAVENOUS | Status: AC
  Filled 2016-02-13: qty 250

## 2016-02-13 MED ORDER — IOPAMIDOL (ISOVUE-370) INJECTION 76%
INTRAVENOUS | Status: AC
  Filled 2016-02-13: qty 100

## 2016-02-13 MED ORDER — SODIUM CHLORIDE 0.9% FLUSH: 3.0000 mL | INTRAVENOUS | Status: DC | PRN

## 2016-02-13 MED ORDER — SODIUM CHLORIDE 0.9 % IV SOLN
INTRAVENOUS | Status: DC
  Administered 2016-02-13 (×2): via INTRAVENOUS

## 2016-02-13 MED ORDER — LIDOCAINE HCL (PF) 1 % IJ SOLN
INTRAMUSCULAR | Status: AC
  Filled 2016-02-13: qty 30

## 2016-02-13 MED ORDER — PRASUGREL HCL 10 MG PO TABS
ORAL_TABLET | ORAL | Status: DC | PRN
  Administered 2016-02-13: 60 mg via ORAL

## 2016-02-13 MED ORDER — FENTANYL CITRATE (PF) 100 MCG/2ML IJ SOLN
INTRAMUSCULAR | Status: DC | PRN
  Administered 2016-02-13 (×2): 25 ug via INTRAVENOUS

## 2016-02-13 MED ORDER — ASPIRIN EC 81 MG PO TBEC: 81.0000 mg | DELAYED_RELEASE_TABLET | Freq: Every day | ORAL | Status: DC

## 2016-02-13 MED ORDER — VERAPAMIL HCL 2.5 MG/ML IV SOLN
INTRAVENOUS | Status: AC
  Filled 2016-02-13: qty 2

## 2016-02-13 MED ORDER — IOPAMIDOL (ISOVUE-370) INJECTION 76%
INTRAVENOUS | Status: DC | PRN
  Administered 2016-02-13: 180 mL via INTRA_ARTERIAL

## 2016-02-13 MED ORDER — ANGIOPLASTY BOOK
Freq: Once | Status: AC
  Administered 2016-02-13: 22:00:00
  Filled 2016-02-13: qty 1

## 2016-02-13 MED ORDER — HEPARIN (PORCINE) IN NACL 2-0.9 UNIT/ML-% IJ SOLN
INTRAMUSCULAR | Status: AC
  Filled 2016-02-13: qty 1000

## 2016-02-13 MED ORDER — BIVALIRUDIN 250 MG IV SOLR
250.0000 mg | INTRAVENOUS | Status: DC | PRN
  Administered 2016-02-13: 1.75 mg/kg/h via INTRAVENOUS

## 2016-02-13 MED ORDER — SODIUM CHLORIDE 0.9 % IV SOLN: 250.0000 mL | INTRAVENOUS | Status: DC | PRN

## 2016-02-13 MED ORDER — DIAZEPAM 5 MG PO TABS: 5.0000 mg | ORAL_TABLET | ORAL | Status: DC | PRN

## 2016-02-13 MED ORDER — LIDOCAINE HCL (PF) 1 % IJ SOLN
INTRAMUSCULAR | Status: DC | PRN
  Administered 2016-02-13: 3 mL

## 2016-02-13 MED ORDER — NITROGLYCERIN 1 MG/10 ML FOR IR/CATH LAB
INTRA_ARTERIAL | Status: AC
  Filled 2016-02-13: qty 10

## 2016-02-13 MED ORDER — VERAPAMIL HCL 2.5 MG/ML IV SOLN
INTRA_ARTERIAL | Status: DC | PRN
  Administered 2016-02-13: 15 mL via INTRA_ARTERIAL

## 2016-02-13 MED ORDER — NITROGLYCERIN 1 MG/10 ML FOR IR/CATH LAB
INTRA_ARTERIAL | Status: DC | PRN
  Administered 2016-02-13: 200 ug via INTRA_ARTERIAL

## 2016-02-13 MED ORDER — ONDANSETRON HCL 4 MG/2ML IJ SOLN: 4.0000 mg | Freq: Four times a day (QID) | INTRAMUSCULAR | Status: DC | PRN

## 2016-02-13 MED ORDER — HEPARIN SODIUM (PORCINE) 1000 UNIT/ML IJ SOLN
INTRAMUSCULAR | Status: DC | PRN
  Administered 2016-02-13: 4500 [IU] via INTRAVENOUS

## 2016-02-13 MED ORDER — ACETAMINOPHEN 325 MG PO TABS: 650.0000 mg | ORAL_TABLET | ORAL | Status: DC | PRN

## 2016-02-13 MED ORDER — FENTANYL CITRATE (PF) 100 MCG/2ML IJ SOLN
INTRAMUSCULAR | Status: AC
  Filled 2016-02-13: qty 2

## 2016-02-13 MED ORDER — BIVALIRUDIN BOLUS VIA INFUSION - CUPID
INTRAVENOUS | Status: DC | PRN
  Administered 2016-02-13: 69.225 mg via INTRAVENOUS

## 2016-02-13 SURGICAL SUPPLY — 19 items
BALLN EUPHORA RX 2.25X12 (BALLOONS) ×3
BALLN ~~LOC~~ EUPHORA RX 3.0X12 (BALLOONS) ×3
BALLOON EUPHORA RX 2.25X12 (BALLOONS) ×1 IMPLANT
BALLOON ~~LOC~~ EUPHORA RX 3.0X12 (BALLOONS) ×1 IMPLANT
CATH INFINITI 5FR ANG PIGTAIL (CATHETERS) ×3 IMPLANT
CATH OPTITORQUE TIG 4.0 5F (CATHETERS) ×3 IMPLANT
CATH SITESEER 5F NTR (CATHETERS) ×3 IMPLANT
CATH VISTA GUIDE 6FR XB3.5 (CATHETERS) ×3 IMPLANT
DEVICE RAD COMP TR BAND LRG (VASCULAR PRODUCTS) ×3 IMPLANT
GLIDESHEATH SLEND SS 6F .021 (SHEATH) ×3 IMPLANT
KIT ENCORE 26 ADVANTAGE (KITS) ×3 IMPLANT
KIT HEART LEFT (KITS) ×3 IMPLANT
PACK CARDIAC CATHETERIZATION (CUSTOM PROCEDURE TRAY) ×3 IMPLANT
STENT RESOLUTE INTEG 2.75X14 (Permanent Stent) ×3 IMPLANT
SYR MEDRAD MARK V 150ML (SYRINGE) ×3 IMPLANT
TRANSDUCER W/STOPCOCK (MISCELLANEOUS) ×3 IMPLANT
TUBING CIL FLEX 10 FLL-RA (TUBING) ×3 IMPLANT
WIRE ASAHI PROWATER 180CM (WIRE) ×3 IMPLANT
WIRE SAFE-T 1.5MM-J .035X260CM (WIRE) ×3 IMPLANT

## 2016-02-13 NOTE — Progress Notes (Signed)
ANTICOAGULATION CONSULT NOTE   Pharmacy Consult for heparin Indication: chest pain/ACS  Allergies  Allergen Reactions  . Ferrous Sulfate Other (See Comments)    Face burning  . Meclizine Other (See Comments)    Burning in face when taking iron  . Metoprolol Other (See Comments)    abd pain  . Pravastatin Other (See Comments)    Myalgias, even when combined with coenzyme q 10  . Ticagrelor Other (See Comments)    Cough, sneezing and nasal congestion  . Lisinopril Cough  . Penicillins Hives and Rash    Patient Measurements: TBW last year 91 kg IBW 68.4 kg Heparin dosing weight: 87 kg  Vital Signs: Temp: 97.6 F (36.4 C) (04/18 0409) Temp Source: Oral (04/18 0409) BP: 150/73 mmHg (04/18 0409) Pulse Rate: 57 (04/18 0409)  Labs:  Recent Labs  02/12/16 1236 02/12/16 2102 02/13/16 0230 02/13/16 0530  HGB 14.6  --   --   --   HCT 44.3  --   --   --   PLT 295  --   --   --   LABPROT 14.6  --   --   --   INR 1.12  --   --   --   HEPARINUNFRC  --   --   --  0.40  CREATININE 1.09  --   --   --   TROPONINI  --  <0.03 <0.03  --     Estimated Creatinine Clearance: 62.6 mL/min (by C-G formula based on Cr of 1.09).   Assessment: 80 yo M presents on 4/17 with chest pressure and DOE. Has extensive cardiac history. Pharmacy consulted to start heparin for ACS. No anticoag PTA. CBC stable. No s/s of bleed. Heparin level 0.4 this am, no sxs of bleeding.   Goal of Therapy:  Heparin level 0.3-0.7 units/ml Monitor platelets by anticoagulation protocol: Yes   Plan:  1. Continue heparin gtt at 1,150 units/hr 2. Monitor daily HL, CBC, s/s of bleed 3. Will f/u cath plans and potentially get confirmatory heparin level later today   Pollyann SamplesAndy Danaysha Kirn, PharmD, BCPS 02/13/2016, 6:09 AM Pager: [phone]

## 2016-02-13 NOTE — Interval H&P Note (Signed)
Cath Lab Visit (complete for each Cath Lab visit)  Clinical Evaluation Leading to the Procedure:   ACS: No.  Non-ACS:    Anginal Classification: CCS III  Anti-ischemic medical therapy: Minimal Therapy (1 class of medications)  Non-Invasive Test Results: No non-invasive testing performed  Prior CABG: No previous CABG      History and Physical Interval Note:  02/13/2016 11:31 AM  [facility]  has presented today for surgery, with the diagnosis of cp  The various methods of treatment have been discussed with the patient and family. After consideration of risks, benefits and other options for treatment, the patient has consented to  Procedure(s): Left Heart Cath and Coronary Angiography (N/A) as a surgical intervention .  The patient's history has been reviewed, patient examined, no change in status, stable for surgery.  I have reviewed the patient's chart and labs.  Questions were answered to the patient's satisfaction.     Robbin Escher A

## 2016-02-13 NOTE — Progress Notes (Signed)
Patient Name: Allen Terry Date of Encounter: 02/13/2016  Hospital Problem List     Active Problems:   Unstable angina [facility])   Pt Profile:   80 yo male with PMH of CAD s/p PCI 01/17/2011 85% proximal RCA with DES, PCI 09/05/2014 60% mid LAD lesion, with DES to LAD/HTN/HLD presenting to the [facility] ED with chest pressure and DOE. Planned Cath today.  Subjective   Reports feeling well this morning.   Inpatient Medications    . aspirin EC  81 mg Oral QPM  . sodium chloride flush  3 mL Intravenous Q12H  . tamsulosin  0.4 mg Oral Daily    Vital Signs    Filed Vitals:   02/12/16 2300 02/12/16 2320 02/13/16 0409 02/13/16 0956  BP:   150/73 140/54  Pulse:   57 60  Temp:   97.6 F (36.4 C)   TempSrc:   Oral   Resp:   18   Height:   (1.727 m)    Weight: 199 lb 15.3 oz (90.7 kg) 203 lb 8 oz (92.307 kg)    SpO2:   100% 100%   No intake or output data in the 24 hours ending 02/13/16 1013 Filed Weights   02/12/16 2300 02/12/16 2320  Weight: 199 lb 15.3 oz (90.7 kg) 203 lb 8 oz (92.307 kg)    Physical Exam    General: Pleasant, NAD. Neuro: Alert and oriented X 3. Moves all extremities spontaneously. Psych: Normal affect. HEENT:  Normal  Neck: Supple without bruits or JVD. Lungs:  Resp regular and unlabored, CTA. Heart: RRR no s3, s4, or murmurs. Abdomen: Soft, non-tender, non-distended, BS + x 4.  Extremities: No clubbing, cyanosis or edema. DP/PT/Radials 2+ and equal bilaterally.  Labs    CBC  Recent Labs  02/12/16 1236 02/13/16 0813  WBC 8.6 8.6  HGB 14.6 14.0  HCT 44.3 43.8  MCV 83.6 83.7  PLT 295 262   Basic Metabolic Panel  Recent Labs  02/12/16 1236 02/13/16 0813  NA 138 140  K 3.4* 3.5  CL 103 104  CO2 24 26  GLUCOSE 122* 129*  BUN 7 7  CREATININE 1.09 1.07  CALCIUM 9.3 8.8*   Cardiac Enzymes  Recent Labs  02/12/16 2102 02/13/16 0230 02/13/16 0813  TROPONINI <0.03 <0.03 <0.03   Fasting Lipid Panel  Recent Labs  02/13/16 0230  CHOL 151  HDL 20*  LDLCALC 91  TRIG 914*  CHOLHDL 7.6    Telemetry    SB Rate- 60  ECG    SB Rate- 55. No ST/T wave abnormalities  Radiology    Dg Chest 2 View  02/12/2016  CLINICAL DATA:  Chest pain and shortness of breath. EXAM: CHEST  2 VIEW COMPARISON:  None. FINDINGS: The heart size and mediastinal contours are within normal limits. Both lungs are clear. The visualized skeletal structures are unremarkable. IMPRESSION: No active cardiopulmonary disease. Electronically Signed   By: Francene Boyers M.D.   On: 02/12/2016 13:05    Assessment & Plan    1. Unstable Angina: Pt reports a 2 week hx of of chest pressure and DOE that has rapidly progressed. States that previously was more active and able to walk 2 miles a day but recently has only been able to walk a few blocks due to exertional dyspnea. States that he stopped taking his Effient because when he was previously taking [place] he developed a GI bleed. Also reports he is not able to take a  statin due to myalgias. Pt is scheduled for Cath today at 12pm, remains on heparin. Troponins have remained negative, AM labs good.  2. HLD: Reports not taking a statin due to myalgias, lipid panel abnormal. Talked with patient about PCSK9 inhibitors, but reports these are to expensive for him even with his insurance.   3. HTN: Currently on HCTZ but this was held due to cath in this morning. Controlled.   Signed, Laverda PageLindsay Roberts NP-C Pager [phone]  I have seen and examined the patient along with Laverda PageLindsay Roberts NP-C.  I have reviewed the chart, notes and new data.  I agree with PA's note.  PLAN: No new angina. For cardiac cath today. This procedure has been fully reviewed with the patient and written informed consent has been obtained.   Thurmon FairMihai Millie Shorb, MD, [facility] [phone] 02/13/2016, 11:46 AM

## 2016-02-13 NOTE — Progress Notes (Signed)
TR BAND REMOVAL  LOCATION:  right radial  DEFLATED PER PROTOCOL:  Yes.    TIME BAND OFF / DRESSING APPLIED:   1730   SITE UPON ARRIVAL:   Level 0  SITE AFTER BAND REMOVAL:  Level 0  CIRCULATION SENSATION AND MOVEMENT:  Within Normal Limits  Yes.    COMMENTS:    

## 2016-02-13 NOTE — Progress Notes (Signed)
Utilization review completed.  

## 2016-02-14 ENCOUNTER — Encounter (HOSPITAL_COMMUNITY): Payer: Self-pay | Admitting: Physician Assistant

## 2016-02-14 ENCOUNTER — Telehealth: Payer: Self-pay | Admitting: Physician Assistant

## 2016-02-14 DIAGNOSIS — I10 Essential (primary) hypertension: Secondary | ICD-10-CM | POA: Diagnosis present

## 2016-02-14 DIAGNOSIS — E785 Hyperlipidemia, unspecified: Secondary | ICD-10-CM | POA: Diagnosis present

## 2016-02-14 DIAGNOSIS — I2510 Atherosclerotic heart disease of native coronary artery without angina pectoris: Secondary | ICD-10-CM

## 2016-02-14 MED ORDER — NITROGLYCERIN 0.4 MG SL SUBL: 0.4000 mg | SUBLINGUAL_TABLET | SUBLINGUAL | Status: AC | PRN

## 2016-02-14 MED ORDER — PRASUGREL HCL 10 MG PO TABS: 10.0000 mg | ORAL_TABLET | Freq: Every day | ORAL | Status: DC

## 2016-02-14 MED ORDER — HYDROCHLOROTHIAZIDE 25 MG PO TABS
25.0000 mg | ORAL_TABLET | Freq: Every day | ORAL | Status: DC
  Administered 2016-02-14: 25 mg via ORAL
  Filled 2016-02-14: qty 1

## 2016-02-14 NOTE — Progress Notes (Signed)
Pt moderately anxious, refused to comply with anyone except his RN French Anaracy last night.  Up in room independently.  BP 182/75, pt holding arm tense. Rt radial level 0.   Encouragement provided.  Pt asking for his "fluid pill."  Home med list shows HCTZ, will ask PA for order on rounds.  Walking in hall without difficulty, eager to go home.

## 2016-02-14 NOTE — Progress Notes (Signed)
D/C instructions reviewed w/ pt.  Questions answered.  Denies complaints.  IV removed by NT.  To front lobby per w/c with NT.

## 2016-02-14 NOTE — Discharge Instructions (Signed)
No driving for 24 hours. No lifting over 5 lbs for 1 week. No sexual activity for 1 week. Keep procedure site clean & dry. If you notice increased pain, swelling, bleeding or pus, call/return!  You may shower, but no soaking baths/hot tubs/pools for 1 week.  ° ° °

## 2016-02-14 NOTE — Progress Notes (Signed)
CARDIAC REHAB PHASE I   PRE:  Rate/Rhythm: 83 SR  BP:  Supine:   Sitting: 186/69  Standing:    SaO2:   MODE:  Ambulation: 800 ft   POST:  Rate/Rhythm: 86 SR  BP:  Supine:   Sitting: 190/69  Standing:    SaO2:  0800-0845 Pt walked 800 ft with steady gait. No CP. Tolerated well except BP elevated. Stressed importance of effient with stent. Reviewed NTG use and ex ed. Gave heart healthy diet for review. Discussed CRP 2 and pt not interested. OK with [facility]. Pt stated has not slept well for three days. Emotional support given.   Luetta Nuttingharlene Anel Purohit, RN BSN  02/14/2016 8:42 AM

## 2016-02-14 NOTE — Progress Notes (Signed)
Pt eager to go home, awaiting d/c order.  PA [facility] text paged for orders.  Pt states the medicine that is given to relax him works "opposite in me, keeps me up."  Pt apologetic for irritability last night.  AM meds reviewed and given.  BP high, pt states "it will be better when I get out of here."

## 2016-02-14 NOTE — Discharge Summary (Signed)
Discharge Summary    Patient ID: Allen Terry,  MRN: [id number], DOB/AGE: 1936/08/25 80 y.o.  Admit date: 02/12/2016 Discharge date: 02/14/2016  Primary Care Provider: Pcp Not In [facility] Primary Cardiologist: [facility] (Previous [facility] at [facility])  Discharge Diagnoses    Principal Problem:   Unstable angina [facility](HCC) Active Problems:   CAD in native artery   DOE (dyspnea on exertion)   Hyperlipidemia   HTN (hypertension)   Allergies Allergies  Allergen Reactions  . Ferrous Sulfate Other (See Comments)    Face burning  . Meclizine Other (See Comments)    Burning in face when taking iron  . Metoprolol Other (See Comments)    abd pain  . Pravastatin Other (See Comments)    Myalgias, even when combined with coenzyme q 10  . Ticagrelor Other (See Comments)    Cough, sneezing and nasal congestion  . Lisinopril Cough  . Penicillins Hives and Rash    Diagnostic Studies/Procedures    Cath 02/13/2016 Conclusion     Ost RCA lesion, 20% stenosed.  1st Diag lesion, 20% stenosed.  Ost 2nd Mrg lesion, 80% stenosed. Post intervention, there is a 0% residual stenosis.  The left ventricular systolic function is normal.  Widely patent stent in the LAD with ostial 20% narrowing in the diagonal vessel immediately prior to the stented segment.  New 80% stenosis at the origin of the left circumflex OM 2 vessel.  Patent proximal RCA stent with 20% narrowing focally at the stent origin and otherwise normal dominant RCA.  Successful PCI with DES stenting of the left circumflex OM 2 ostium with the stent extending from the circumflex into the OM 2 vessel with a diminutive AV groove circumflex branch arising immediately proximal to the stenotic segment.  RECOMMENDATION: The patient will continue with dual antiplatelet therapy. His previous interventions were done at [facility]. I suspect he may be Plavix unresponsive since he states that genetic testing was done. In the  past he had developed bleeding issues on Brilinta but had tolerated Effient.    _____________   History of Present Illness     80 yo male with PMH of CAD s/p PCI 01/17/2011 85% proximal RCA with DES, PCI 09/05/2014 60% mid LAD lesion, with DES to LAD/HTN/HLD presenting to the [facility] with chest pressure and DOE. He is a patient of [facility] and followed at [facility] for cardiology with last visit being 04/2015. Last Echo was 09/05/2014 which showed EF 65-70% with NWMA and no valve abnormality.   Reports over the past 2 weeks he has developed chest pressure and DOE, normally is able to walk 2 miles a day, but within the past 2 weeks he has not even been able to walk 2 blocks without having increasing dyspnea. States he is normally very active and this has come to a point that is is interfering with his daily activities. Reports that symptoms seemed to become worse this morning with getting up to walk to the bathroom, and states he felt like he could barely walk. Pt reports he came to the ED today because he continues to have this full sensation in his chest. Reports being noncompliant with medications and stopped taking his anticoag, statin, and a majority if his other home medications.   In the ED patient had a negative chest x-ray, a negative POc trop with a normal hgb, and Cr. EKG showed NSR Rate-80 with no acute ST/Twave abnormalities. Denies currently having any chest  pain, or dyspnea.  Hospital Course     His symptom was concerning for unstable angina. He underwent cardiac catheterization on 02/13/2016 which showed a 20% ostial RCA, 20% D1 lesion, 80% OM 2 lesion treated with 2.7514 mm Resolute DES. He was seen on the following day on 02/14/2016, he did not sleep overnight, his blood pressure was high however we will hold off on adding any medication at this time, as his blood pressure is usually very good at home on hydrochlorothiazide. He is intolerant to ACEI and beta blocker. He is right radial  cath site appears to be stable without bleeding and 2+ distal pulses. Emphasis has been placed on compliance with dual antiplatelet therapy. He is deemed stable for discharge from cardiology perspective. I have arranged 2 weeks follow-up in our [facility] office.   _____________  Discharge Vitals Blood pressure 186/69, pulse 82, temperature 97.6 F (36.4 C), temperature source Oral, resp. rate 17, height  (1.727 m), weight 203 lb 8 oz (92.307 kg), SpO2 98 %.  Filed Weights   02/12/16 2300 02/12/16 2320  Weight: 199 lb 15.3 oz (90.7 kg) 203 lb 8 oz (92.307 kg)    Labs & Radiologic Studies     CBC  Recent Labs  02/12/16 1236 02/13/16 0813  WBC 8.6 8.6  HGB 14.6 14.0  HCT 44.3 43.8  MCV 83.6 83.7  PLT 295 262   Basic Metabolic Panel  Recent Labs  02/12/16 1236 02/13/16 0813  NA 138 140  K 3.4* 3.5  CL 103 104  CO2 24 26  GLUCOSE 122* 129*  BUN 7 7  CREATININE 1.09 1.07  CALCIUM 9.3 8.8*   Cardiac Enzymes  Recent Labs  02/13/16 0230 02/13/16 0813 02/13/16 1809  TROPONINI <0.03 <0.03 0.03   Fasting Lipid Panel  Recent Labs  02/13/16 0230  CHOL 151  HDL 20*  LDLCALC 91  TRIG 161*  CHOLHDL 7.6   Dg Chest 2 View  02/12/2016  CLINICAL DATA:  Chest pain and shortness of breath. EXAM: CHEST  2 VIEW COMPARISON:  None. FINDINGS: The heart size and mediastinal contours are within normal limits. Both lungs are clear. The visualized skeletal structures are unremarkable. IMPRESSION: No active cardiopulmonary disease. Electronically Signed   By: Allen Boyers M.D.   On: 02/12/2016 13:05    Disposition   Pt is being discharged home today in good condition.  Follow-up Plans & Appointments    Follow-up Information    Follow up with [facility] On 02/29/2016.   Specialties:  Physician Assistant, Cardiology, Radiology   Why:  1:30PM. Cardiology visit   Contact information:   [address] [place] [address] [phone]       Discharge Instructions    Amb Referral to Cardiac Rehabilitation    Complete by:  As directed   Diagnosis:  Coronary Stents Comment - not interested in attending     Diet - low sodium heart healthy    Complete by:  As directed      Increase activity slowly    Complete by:  As directed            Discharge Medications   Current Discharge Medication List    START taking these medications   Details  nitroGLYCERIN (NITROSTAT) 0.4 MG SL tablet Place 1 tablet (0.4 mg total) under the tongue every 5 (five) minutes x 3 doses as needed for chest pain. Qty: 25 tablet, Refills: 3    prasugrel (EFFIENT) 10 MG  TABS tablet Take 1 tablet (10 mg total) by mouth daily. Qty: 90 tablet, Refills: 3      CONTINUE these medications which have NOT CHANGED   Details  aspirin EC 81 MG tablet Take 81 mg by mouth every evening.     hydrochlorothiazide (HYDRODIURIL) 25 MG tablet Take 25 mg by mouth daily.    tamsulosin (FLOMAX) 0.4 MG CAPS capsule Take 0.4 mg by mouth daily.    meclizine (ANTIVERT) 12.5 MG tablet Take 1 tablet (12.5 mg total) by mouth 3 (three) times daily as needed for dizziness or nausea. Qty: 16 tablet, Refills: 1         Aspirin prescribed at discharge?  Yes High Intensity Statin Prescribed? (Lipitor 40-80mg  or Crestor 20-40mg ): No: intolerant Beta Blocker Prescribed? No: intolerant For EF 45% or less, Was ACEI/ARB Prescribed? No: intolerant ADP Receptor Inhibitor Prescribed? (i.e. Plavix etc.-Includes Medically Managed Patients): Yes For EF <40%, Aldosterone Inhibitor Prescribed? No: EF normal Was EF assessed during THIS hospitalization? Yes Was Cardiac Rehab II ordered? (Included Medically managed Patients): Yes   Outstanding Labs/Studies   None  Duration of Discharge Encounter   Greater than 30 minutes including physician time.  Signed, Azalee Course Terry 02/14/2016, 10:32 AM

## 2016-02-14 NOTE — Progress Notes (Signed)
Sonja, CNA attempted to reattach telemetry leads, obtain vitals, and EKG. Patient was verbally aggressive, using profanity and shouting. Celine MansSonja reports she explained all tasks to be performed prior to carrying out tasks and patient consents and understands, yet continues to be verbally aggressive.

## 2016-02-14 NOTE — Progress Notes (Signed)
Shift report Rosanne AshingJim RN reports pt needs right radial dressing change due, reports saturated with blood; site reported as level 0. Upon bedside report site is noted to be a level 1 due to the oozing of blood. Pressure held at site per protocol and dressing changed . approx 2115 pt radial site dressing is noted to have new bloody drainage; dressing changed pressure held approx 5 minutes, pt refused to let me hold pressure longer. 2320 pt called states radial site needs dressing change. Pressure held 20 minutes; paged Dr. Onalee HuaAlvarez; no call back. Pt is otherwise in stable condition. Hemostasis is noted.

## 2016-02-14 NOTE — Progress Notes (Signed)
Lab staff exiting room; patient refuses to be stuck for lab work.

## 2016-02-14 NOTE — Progress Notes (Signed)
Patient Name: Barton DuboisJerald J Walpole Date of Encounter: 02/14/2016     Principal Problem:   Unstable angina [facility](HCC) Active Problems:   CAD in native artery   DOE (dyspnea on exertion)   Hyperlipidemia   HTN (hypertension)    SUBJECTIVE  Ambulated this morning, denies any CP or SOB.   CURRENT MEDS . aspirin EC  81 mg Oral QPM  . prasugrel  10 mg Oral Daily  . sodium chloride flush  3 mL Intravenous Q12H  . tamsulosin  0.4 mg Oral Daily    OBJECTIVE  Filed Vitals:   02/13/16 2320 02/14/16 0500 02/14/16 0724 02/14/16 0748  BP: 147/70  182/75 186/69  Pulse: 60 65  82  Temp:  97.7 F (36.5 C)  97.6 F (36.4 C)  TempSrc:  Oral  Oral  Resp: 19 13 22 17   Height:      Weight:      SpO2: 99% 99%  98%    Intake/Output Summary (Last 24 hours) at 02/14/16 0755 Last data filed at 02/14/16 0727  Gross per 24 hour  Intake 2970.83 ml  Output   1500 ml  Net 1470.83 ml   Filed Weights   02/12/16 2300 02/12/16 2320  Weight: 199 lb 15.3 oz (90.7 kg) 203 lb 8 oz (92.307 kg)    PHYSICAL EXAM  General: Pleasant, NAD. Neuro: Alert and oriented X 3. Moves all extremities spontaneously. Psych: Normal affect. HEENT:  Normal  Neck: Supple without bruits or JVD. Lungs:  Resp regular and unlabored, CTA. Heart: RRR no s3, s4, or murmurs. R radial cath site stable, no bleeding, 2+ distal radial pulse Abdomen: Soft, non-tender, non-distended, BS + x 4.  Extremities: No clubbing, cyanosis or edema. DP/PT/Radials 2+ and equal bilaterally.  Accessory Clinical Findings  CBC  Recent Labs  02/12/16 1236 02/13/16 0813  WBC 8.6 8.6  HGB 14.6 14.0  HCT 44.3 43.8  MCV 83.6 83.7  PLT 295 262   Basic Metabolic Panel  Recent Labs  02/12/16 1236 02/13/16 0813  NA 138 140  K 3.4* 3.5  CL 103 104  CO2 24 26  GLUCOSE 122* 129*  BUN 7 7  CREATININE 1.09 1.07  CALCIUM 9.3 8.8*   Cardiac Enzymes  Recent Labs  02/13/16 0230 02/13/16 0813 02/13/16 1809  TROPONINI <0.03 <0.03  0.03   Fasting Lipid Panel  Recent Labs  02/13/16 0230  CHOL 151  HDL 20*  LDLCALC 91  TRIG [id number]*  CHOLHDL 7.6    TELE NSR with HR 70s    ECG  NSR without significant ST-T wave changes   Radiology/Studies  Dg Chest 2 View  02/12/2016  CLINICAL DATA:  Chest pain and shortness of breath. EXAM: CHEST  2 VIEW COMPARISON:  None. FINDINGS: The heart size and mediastinal contours are within normal limits. Both lungs are clear. The visualized skeletal structures are unremarkable. IMPRESSION: No active cardiopulmonary disease. Electronically Signed   By: Francene BoyersJames  Maxwell M.D.   On: 02/12/2016 13:05    ASSESSMENT AND PLAN  1. Unstable angina  - Cath 02/13/2016 20% ost RCA, 20% D1, 80% ost OM2 treated with 2.7514 mm Resolute DES  - continue ASA, effient, intolerant to statin. Ambulated this morning no CP or DOE. Refused lab overnight.   - expect discharge today, followup with Dr. Willa RoughHicks (his previous cardiologist)  2. CAD s/p PCI 01/17/2011 85% proximal RCA with DES, PCI 09/05/2014 60% mid LAD lesion, with DES to LAD  3. HTN: BP high this morning,  as patient has been angry, verbally abusive to nursing staff last night. Fairly pleasant this morning. Says he is intolerant to multiple meds. Lisinopril and metoprolol intolerant. Will restart home HCTZ. Although BP is 170 here, he says it is because he has not slept last night and he is angry, his home BP is good on HCTZ. Will not add more BP med at this time.    4. HLD: Reports not taking a statin due to myalgias, lipid panel abnormal. PCSK9 inhibitors are too expensive for him even with his insurance.   Ramond Dial PA-C Pager: [phone]  I have seen and examined the patient along with Azalee Course PA-C.  I have reviewed the chart, notes and new data.  I agree with PA's note.  Key new complaints: no physical complaints; he has a very short temper and very firm opinions Key examination changes: no groin complications Key new findings /  data: biggest risk factor liability is very low HDL cholesterol. Substantial weight loss and exercisse are the most likely interventions that could help. I agree that a statin would be beneficial, but he is no mood to negotiate this.  PLAN: DC home. Continue ASA + Effient minimum 12 months (clopidogrel genetic hyporesponder). Try to reevaluate Rx of HTN and dyslipidemia at office f/u.  Thurmon Fair, MD, [address] [facility] [phone] 02/14/2016, 9:06 AM

## 2016-02-14 NOTE — Care Management Note (Signed)
Case Management Note  Patient Details  Name: [facility] MRN: [id number] Date of Birth: [birth year]  Subjective/Objective:     [place], [place], s/p stent intervention, pta indep.  Will be on Effient, NCM gave patient 30 day savings card, he has medicaid in which his co pay will be $3.50.  NCM called his pharmacy , [facility] [place] to make sure they have in stock and they do.  No other needs.                Action/Plan:   Expected Discharge Date:                  Expected Discharge Plan:  Home/Self Care  In-House Referral:     Discharge planning Services  CM Consult  Post Acute Care Choice:    Choice offered to:     DME Arranged:    DME Agency:     HH Arranged:    HH Agency:     Status of Service:  Completed, signed off  Medicare Important Message Given:    Date Medicare IM Given:    Medicare IM give by:    Date Additional Medicare IM Given:    Additional Medicare Important Message give by:     If discussed at Long Length of Stay Meetings, dates discussed:    Additional Comments:  Leone Havenaylor, Ailee Pates [place], RN 02/14/2016, 11:08 AM

## 2016-02-14 NOTE — Telephone Encounter (Signed)
  [facility] on April 19.  Patient understands to follow up with provider Eula Listenyan Dunn, PA on May 4 at 1:30pm [facility]. Patient understands discharge instructions? yes Patient understands medications and regiment? yes Patient understands to bring all medications to this visit? yes  Pt states "I can't promise I will be there. I'm in the middle of fixing my sandwich and don't want to think about it now." Asked pt to please call us if he decides to cancel. Pt verbalized understanding.

## 2016-02-29 ENCOUNTER — Encounter: Payer: Medicare Other | Admitting: Physician Assistant

## 2016-06-26 ENCOUNTER — Emergency Department: Payer: Medicare Other

## 2016-06-26 ENCOUNTER — Encounter: Payer: Self-pay | Admitting: Emergency Medicine

## 2016-06-26 ENCOUNTER — Emergency Department
Admission: EM | Admit: 2016-06-26 | Discharge: 2016-06-26 | Disposition: A | Discharge status: Home / Self Care | Payer: Medicare Other | Attending: Emergency Medicine | Admitting: Emergency Medicine

## 2016-06-26 DIAGNOSIS — S0003XA Contusion of scalp, initial encounter: Secondary | ICD-10-CM | POA: Diagnosis not present

## 2016-06-26 DIAGNOSIS — S40012A Contusion of left shoulder, initial encounter: Secondary | ICD-10-CM | POA: Diagnosis not present

## 2016-06-26 DIAGNOSIS — Y999 Unspecified external cause status: Secondary | ICD-10-CM | POA: Insufficient documentation

## 2016-06-26 DIAGNOSIS — Y92410 Unspecified street and highway as the place of occurrence of the external cause: Secondary | ICD-10-CM | POA: Diagnosis not present

## 2016-06-26 DIAGNOSIS — S60812A Abrasion of left wrist, initial encounter: Secondary | ICD-10-CM | POA: Diagnosis not present

## 2016-06-26 DIAGNOSIS — I10 Essential (primary) hypertension: Secondary | ICD-10-CM | POA: Insufficient documentation

## 2016-06-26 DIAGNOSIS — S161XXA Strain of muscle, fascia and tendon at neck level, initial encounter: Secondary | ICD-10-CM | POA: Insufficient documentation

## 2016-06-26 DIAGNOSIS — I2510 Atherosclerotic heart disease of native coronary artery without angina pectoris: Secondary | ICD-10-CM | POA: Insufficient documentation

## 2016-06-26 DIAGNOSIS — R0602 Shortness of breath: Secondary | ICD-10-CM | POA: Insufficient documentation

## 2016-06-26 DIAGNOSIS — S199XXA Unspecified injury of neck, initial encounter: Secondary | ICD-10-CM | POA: Diagnosis present

## 2016-06-26 DIAGNOSIS — R1010 Upper abdominal pain, unspecified: Secondary | ICD-10-CM | POA: Diagnosis not present

## 2016-06-26 DIAGNOSIS — T07XXXA Unspecified multiple injuries, initial encounter: Secondary | ICD-10-CM

## 2016-06-26 DIAGNOSIS — S0990XA Unspecified injury of head, initial encounter: Secondary | ICD-10-CM | POA: Insufficient documentation

## 2016-06-26 DIAGNOSIS — Y9389 Activity, other specified: Secondary | ICD-10-CM | POA: Insufficient documentation

## 2016-06-26 DIAGNOSIS — Z87891 Personal history of nicotine dependence: Secondary | ICD-10-CM | POA: Diagnosis not present

## 2016-06-26 DIAGNOSIS — T1490XA Injury, unspecified, initial encounter: Secondary | ICD-10-CM

## 2016-06-26 LAB — CBC
HCT: 39.4 % — ABNORMAL LOW (ref 40.0–52.0)
HEMOGLOBIN: 13.4 g/dL (ref 13.0–18.0)
MCH: 27.6 pg (ref 26.0–34.0)
MCHC: 34.1 g/dL (ref 32.0–36.0)
MCV: 80.8 fL (ref 80.0–100.0)
Platelets: 346 10*3/uL (ref 150–440)
RBC: 4.87 MIL/uL (ref 4.40–5.90)
RDW: 17.9 % — ABNORMAL HIGH (ref 11.5–14.5)
WBC: 14.8 10*3/uL — ABNORMAL HIGH (ref 3.8–10.6)

## 2016-06-26 LAB — BASIC METABOLIC PANEL
Anion gap: 7 (ref 5–15)
BUN: 9 mg/dL (ref 6–20)
CHLORIDE: 104 mmol/L (ref 101–111)
CO2: 25 mmol/L (ref 22–32)
Calcium: 9.1 mg/dL (ref 8.9–10.3)
Creatinine, Ser: 1.01 mg/dL (ref 0.61–1.24)
GFR calc non Af Amer: >60 mL/min (ref >60)
Glucose, Bld: 127 mg/dL — ABNORMAL HIGH (ref 65–99)
POTASSIUM: 3.9 mmol/L (ref 3.5–5.1)
SODIUM: 136 mmol/L (ref 135–145)

## 2016-06-26 MED ORDER — IOPAMIDOL (ISOVUE-300) INJECTION 61%
100.0000 mL | Freq: Once | INTRAVENOUS | Status: AC | PRN
  Administered 2016-06-26: 100 mL via INTRAVENOUS
  Filled 2016-06-26: qty 100

## 2016-06-26 MED ORDER — IBUPROFEN 600 MG PO TABS: 600.0000 mg | ORAL_TABLET | Freq: Three times a day (TID) | ORAL | 0 refills | Status: DC | PRN

## 2016-06-26 MED ORDER — ENALAPRIL MALEATE 10 MG PO TABS: 10.0000 mg | ORAL_TABLET | Freq: Once | ORAL | Status: DC

## 2016-06-26 MED ORDER — OXYCODONE-ACETAMINOPHEN 5-325 MG PO TABS: 1.0000 | ORAL_TABLET | ORAL | 0 refills | Status: DC | PRN

## 2016-06-26 MED ORDER — BACITRACIN ZINC 500 UNIT/GM EX OINT
TOPICAL_OINTMENT | CUTANEOUS | Status: AC
  Filled 2016-06-26: qty 1.8

## 2016-06-26 MED ORDER — SODIUM CHLORIDE 0.9 % IV BOLUS (SEPSIS)
1000.0000 mL | Freq: Once | INTRAVENOUS | Status: AC
  Administered 2016-06-26: 1000 mL via INTRAVENOUS

## 2016-06-26 MED ORDER — HYDROMORPHONE HCL 1 MG/ML IJ SOLN
0.5000 mg | Freq: Once | INTRAMUSCULAR | Status: AC
  Administered 2016-06-26: 0.5 mg via INTRAVENOUS

## 2016-06-26 MED ORDER — OXYCODONE-ACETAMINOPHEN 5-325 MG PO TABS
1.0000 | ORAL_TABLET | Freq: Once | ORAL | Status: AC
  Administered 2016-06-26: 1 via ORAL
  Filled 2016-06-26: qty 1

## 2016-06-26 MED ORDER — HYDROMORPHONE HCL 1 MG/ML IJ SOLN
INTRAMUSCULAR | Status: AC
  Administered 2016-06-26: 0.5 mg via INTRAVENOUS
  Filled 2016-06-26: qty 1

## 2016-06-26 MED ORDER — FENTANYL CITRATE (PF) 100 MCG/2ML IJ SOLN
75.0000 ug | Freq: Once | INTRAMUSCULAR | Status: AC
  Administered 2016-06-26: 75 ug via INTRAVENOUS
  Filled 2016-06-26: qty 1.5

## 2016-06-26 MED ORDER — FENTANYL CITRATE (PF) 100 MCG/2ML IJ SOLN
INTRAMUSCULAR | Status: AC
  Administered 2016-06-26: 75 ug via INTRAVENOUS
  Filled 2016-06-26: qty 2
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
## 2016-06-26 NOTE — ED Notes (Signed)
Pt. Going home with family.  Pt. Going home in hospital gown.

## 2016-06-26 NOTE — ED Notes (Signed)
Applied bacitracin and bandages to rt. Hand, lt hand and fingers.

## 2016-06-26 NOTE — Discharge Instructions (Signed)
You may take Tylenol or Motrin for mild to moderate pain. You may take Percocet for severe pain. Do not drive within 8 hours of taking Percocet. If you take Percocet, please take an over-the-counter stool softener to prevent constipation.  For your neck pain, you may apply ice or heat, whichever feels better, for 10 minutes every 2-3 hours.  For the swelling on your left shoulder blade, please apply ice for 10 minutes every 2-3 hours when you're awake.  Return to the emergency department if you develop severe pain, headache, vomiting, numbness tingling or weakness, shortness of breath, or any other symptoms concerning to you.
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
## 2016-06-26 NOTE — ED Notes (Signed)
Helped pt. Up to use bathroom, pt. Ambulated with no assistance.

## 2017-05-17 IMAGING — CR DG CHEST 2V
2 series · 2 of 2 positions shown · non-contrast
Comparison: None.

CLINICAL DATA: Chest pain and shortness of breath.

EXAM:
CHEST  2 VIEW

[chest pa]
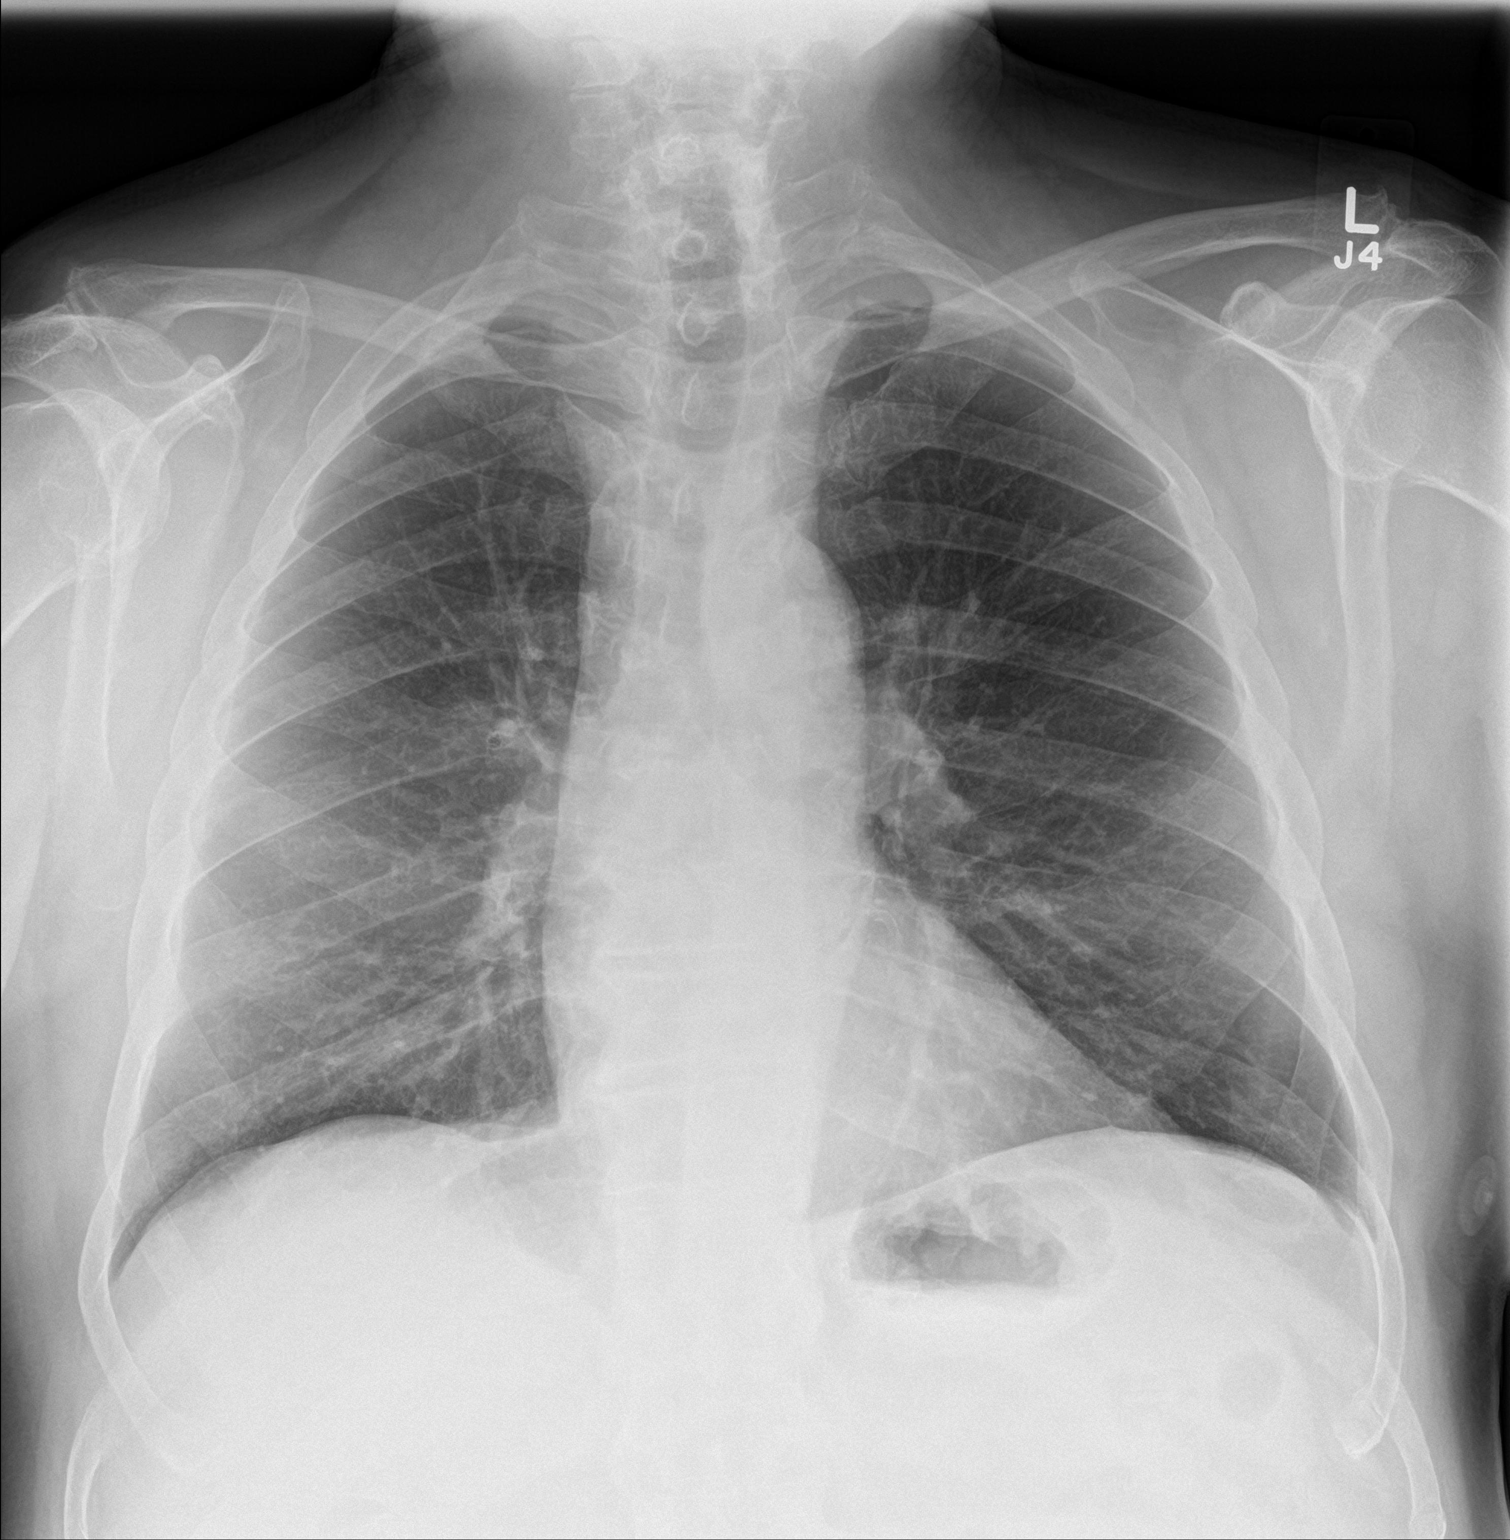

[chest lat]
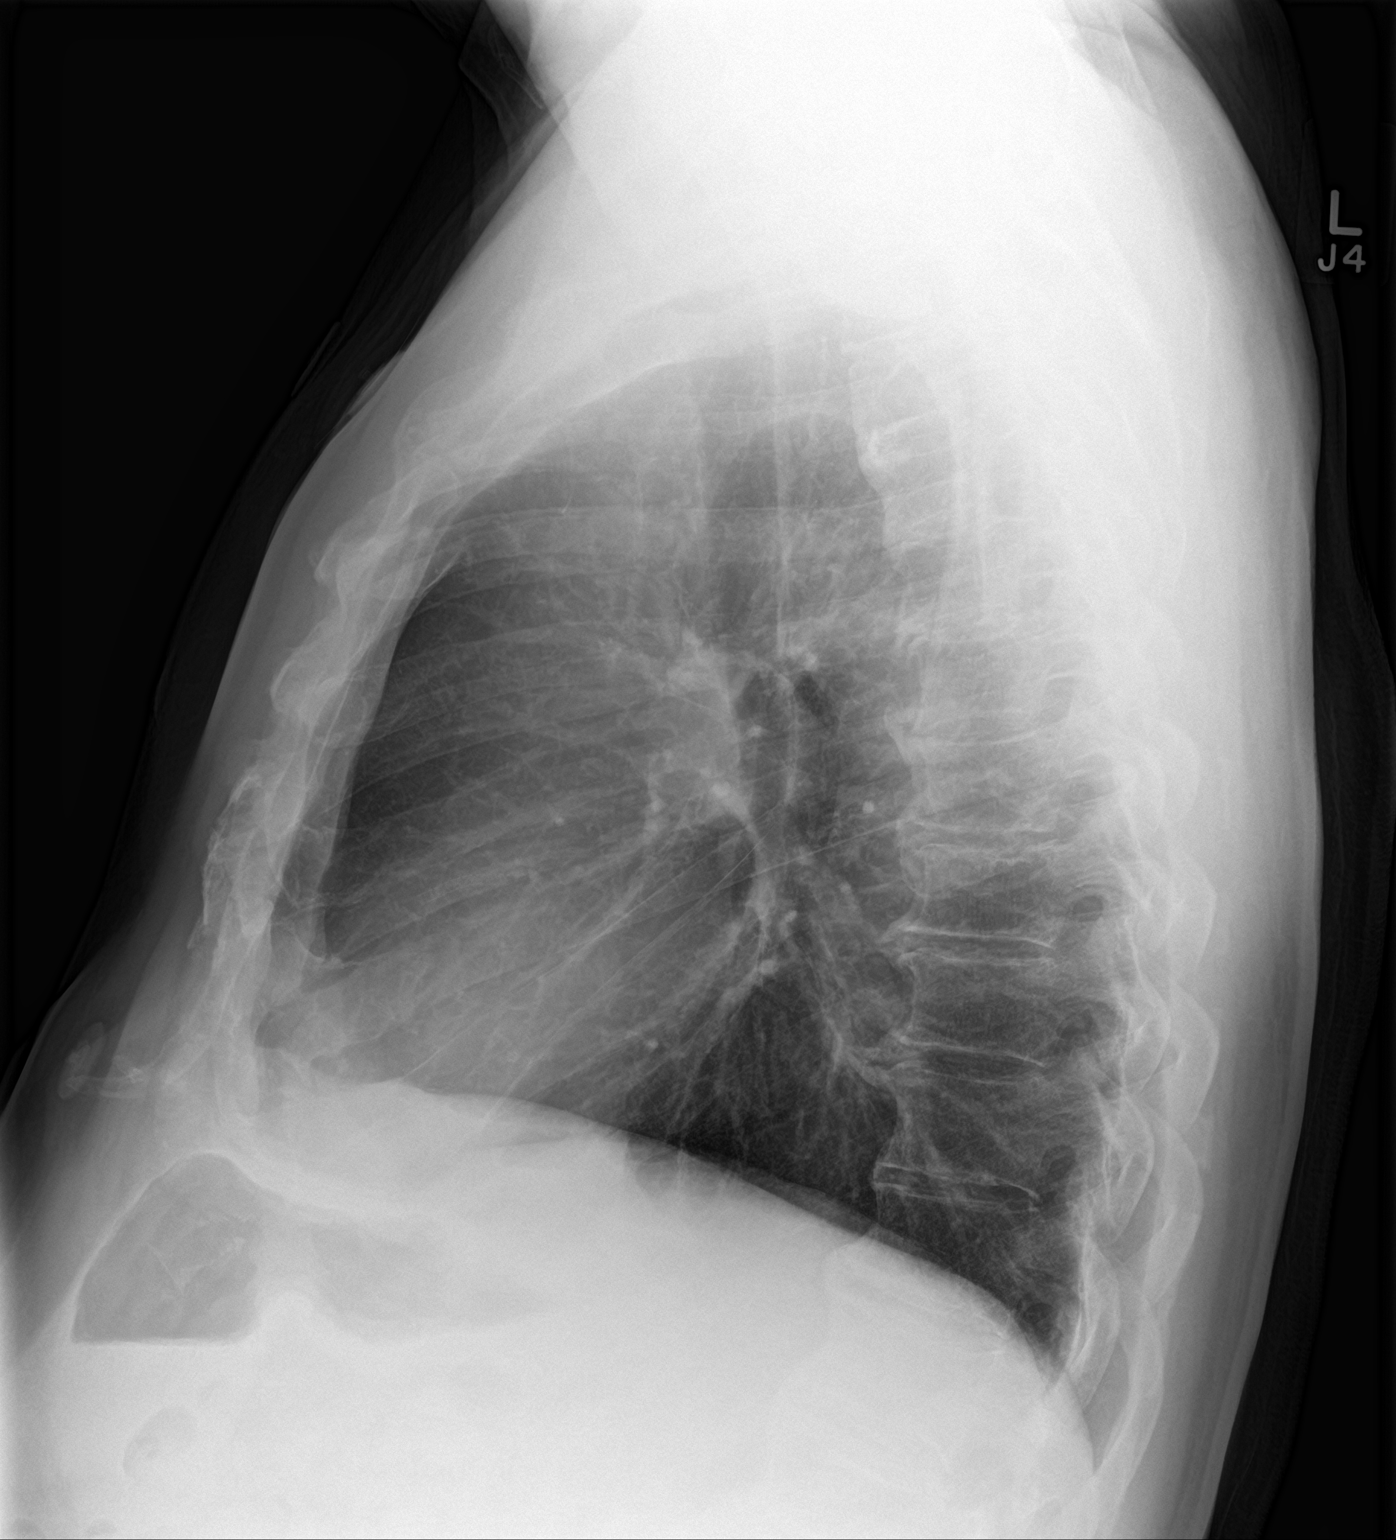

[2 of 2 positions shown; findings below may reference images not displayed]

FINDINGS: The heart size and mediastinal contours are within normal limits.
Both lungs are clear. The visualized skeletal structures are
unremarkable.
IMPRESSION: No active cardiopulmonary disease.
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
## 2018-12-31 ENCOUNTER — Other Ambulatory Visit: Payer: Self-pay

## 2018-12-31 ENCOUNTER — Emergency Department (HOSPITAL_COMMUNITY): Payer: Medicare Other

## 2018-12-31 ENCOUNTER — Encounter (HOSPITAL_COMMUNITY): Payer: Self-pay | Admitting: Cardiology

## 2018-12-31 ENCOUNTER — Inpatient Hospital Stay (HOSPITAL_COMMUNITY)
Admission: EM | Admit: 2018-12-31 | Discharge: 2019-01-02 | DRG: 247 | Disposition: A | Discharge status: Home / Self Care | Payer: Medicare Other | Attending: Cardiovascular Disease | Admitting: Cardiovascular Disease

## 2018-12-31 DIAGNOSIS — Z9841 Cataract extraction status, right eye: Secondary | ICD-10-CM | POA: Diagnosis not present

## 2018-12-31 DIAGNOSIS — Z87891 Personal history of nicotine dependence: Secondary | ICD-10-CM | POA: Diagnosis not present

## 2018-12-31 DIAGNOSIS — Z79899 Other long term (current) drug therapy: Secondary | ICD-10-CM

## 2018-12-31 DIAGNOSIS — Z9842 Cataract extraction status, left eye: Secondary | ICD-10-CM | POA: Diagnosis not present

## 2018-12-31 DIAGNOSIS — I25110 Atherosclerotic heart disease of native coronary artery with unstable angina pectoris: Principal | ICD-10-CM | POA: Diagnosis present

## 2018-12-31 DIAGNOSIS — I200 Unstable angina: Secondary | ICD-10-CM | POA: Diagnosis not present

## 2018-12-31 DIAGNOSIS — Z961 Presence of intraocular lens: Secondary | ICD-10-CM | POA: Diagnosis present

## 2018-12-31 DIAGNOSIS — E876 Hypokalemia: Secondary | ICD-10-CM

## 2018-12-31 DIAGNOSIS — E782 Mixed hyperlipidemia: Secondary | ICD-10-CM | POA: Diagnosis present

## 2018-12-31 DIAGNOSIS — R7303 Prediabetes: Secondary | ICD-10-CM

## 2018-12-31 DIAGNOSIS — Z7982 Long term (current) use of aspirin: Secondary | ICD-10-CM | POA: Diagnosis not present

## 2018-12-31 DIAGNOSIS — Z8249 Family history of ischemic heart disease and other diseases of the circulatory system: Secondary | ICD-10-CM | POA: Diagnosis not present

## 2018-12-31 DIAGNOSIS — Z955 Presence of coronary angioplasty implant and graft: Secondary | ICD-10-CM | POA: Diagnosis not present

## 2018-12-31 DIAGNOSIS — R0789 Other chest pain: Secondary | ICD-10-CM | POA: Diagnosis present

## 2018-12-31 DIAGNOSIS — R079 Chest pain, unspecified: Secondary | ICD-10-CM

## 2018-12-31 DIAGNOSIS — Z888 Allergy status to other drugs, medicaments and biological substances status: Secondary | ICD-10-CM

## 2018-12-31 DIAGNOSIS — I10 Essential (primary) hypertension: Secondary | ICD-10-CM | POA: Diagnosis present

## 2018-12-31 DIAGNOSIS — Z880 Allergy status to penicillin: Secondary | ICD-10-CM | POA: Diagnosis not present

## 2018-12-31 DIAGNOSIS — I2510 Atherosclerotic heart disease of native coronary artery without angina pectoris: Secondary | ICD-10-CM | POA: Diagnosis present

## 2018-12-31 DIAGNOSIS — E785 Hyperlipidemia, unspecified: Secondary | ICD-10-CM | POA: Diagnosis present

## 2018-12-31 HISTORY — DX: Patient's noncompliance with other medical treatment and regimen due to unspecified reason: Z91.199

## 2018-12-31 HISTORY — DX: Patient's noncompliance with other medical treatment and regimen: Z91.19

## 2018-12-31 HISTORY — DX: Prediabetes: R73.03

## 2018-12-31 LAB — CBC WITH DIFFERENTIAL/PLATELET
ABS IMMATURE GRANULOCYTES: 0.03 10*3/uL (ref 0.00–0.07)
BASOS ABS: 0.1 10*3/uL (ref 0.0–0.1)
BASOS PCT: 1 %
EOS PCT: 2 %
Eosinophils Absolute: 0.2 10*3/uL (ref 0.0–0.5)
HCT: 43.3 % (ref 39.0–52.0)
Hemoglobin: 13.6 g/dL (ref 13.0–17.0)
Immature Granulocytes: 0 %
Lymphocytes Relative: 24 %
Lymphs Abs: 1.9 10*3/uL (ref 0.7–4.0)
MCH: 25.7 pg — ABNORMAL LOW (ref 26.0–34.0)
MCHC: 31.4 g/dL (ref 30.0–36.0)
MCV: 81.9 fL (ref 80.0–100.0)
MONO ABS: 0.8 10*3/uL (ref 0.1–1.0)
Monocytes Relative: 10 %
NEUTROS ABS: 4.9 10*3/uL (ref 1.7–7.7)
NRBC: 0 % (ref 0.0–0.2)
Neutrophils Relative %: 63 %
PLATELETS: 325 10*3/uL (ref 150–400)
RBC: 5.29 MIL/uL (ref 4.22–5.81)
RDW: 14.5 % (ref 11.5–15.5)
WBC: 7.9 10*3/uL (ref 4.0–10.5)

## 2018-12-31 LAB — BASIC METABOLIC PANEL
Anion gap: 12 (ref 5–15)
BUN: 8 mg/dL (ref 8–23)
CHLORIDE: 100 mmol/L (ref 98–111)
CO2: 23 mmol/L (ref 22–32)
CREATININE: 0.98 mg/dL (ref 0.61–1.24)
Calcium: 9.1 mg/dL (ref 8.9–10.3)
GFR calc Af Amer: >60 mL/min (ref >60)
GFR calc non Af Amer: >60 mL/min (ref >60)
Glucose, Bld: 132 mg/dL — ABNORMAL HIGH (ref 70–99)
Potassium: 3.1 mmol/L — ABNORMAL LOW (ref 3.5–5.1)
SODIUM: 135 mmol/L (ref 135–145)

## 2018-12-31 LAB — TROPONIN I: Troponin I: <0.03 ng/mL (ref <0.03)

## 2018-12-31 LAB — MAGNESIUM: MAGNESIUM: 2.3 mg/dL (ref 1.7–2.4)

## 2018-12-31 LAB — T4, FREE: Free T4: 0.72 ng/dL — ABNORMAL LOW (ref 0.82–1.77)

## 2018-12-31 LAB — HEMOGLOBIN A1C
HEMOGLOBIN A1C: 6.2 % — AB (ref 4.8–5.6)
MEAN PLASMA GLUCOSE: 131.24 mg/dL

## 2018-12-31 LAB — TSH: TSH: 2.371 u[IU]/mL (ref 0.350–4.500)

## 2018-12-31 MED ORDER — HEPARIN BOLUS VIA INFUSION
4000.0000 [IU] | Freq: Once | INTRAVENOUS | Status: AC
  Administered 2018-12-31: 4000 [IU] via INTRAVENOUS
  Filled 2018-12-31: qty 4000

## 2018-12-31 MED ORDER — TRAZODONE HCL 50 MG PO TABS
50.0000 mg | ORAL_TABLET | Freq: Every evening | ORAL | Status: DC | PRN
  Administered 2018-12-31: 50 mg via ORAL
  Filled 2018-12-31: qty 1

## 2018-12-31 MED ORDER — ASPIRIN EC 81 MG PO TBEC: 81.0000 mg | DELAYED_RELEASE_TABLET | Freq: Every day | ORAL | Status: DC

## 2018-12-31 MED ORDER — SODIUM CHLORIDE 0.9% FLUSH: 3.0000 mL | INTRAVENOUS | Status: DC | PRN

## 2018-12-31 MED ORDER — ASPIRIN 81 MG PO CHEW
81.0000 mg | CHEWABLE_TABLET | ORAL | Status: AC
  Administered 2019-01-01: 81 mg via ORAL
  Filled 2018-12-31: qty 1

## 2018-12-31 MED ORDER — SODIUM CHLORIDE 0.9 % IV SOLN: 250.0000 mL | INTRAVENOUS | Status: DC | PRN

## 2018-12-31 MED ORDER — SODIUM CHLORIDE 0.9 % WEIGHT BASED INFUSION: 3.0000 mL/kg/h | INTRAVENOUS | Status: DC

## 2018-12-31 MED ORDER — TAMSULOSIN HCL 0.4 MG PO CAPS
0.4000 mg | ORAL_CAPSULE | Freq: Every day | ORAL | Status: DC
  Administered 2019-01-01 – 2019-01-02 (×2): 0.4 mg via ORAL
  Filled 2018-12-31 (×2): qty 1

## 2018-12-31 MED ORDER — HEPARIN (PORCINE) 25000 UT/250ML-% IV SOLN
1500.0000 [IU]/h | INTRAVENOUS | Status: DC
  Administered 2018-12-31: 1150 [IU]/h via INTRAVENOUS
  Administered 2019-01-01: 1500 [IU]/h via INTRAVENOUS
  Filled 2018-12-31 (×2): qty 250

## 2018-12-31 MED ORDER — ONDANSETRON HCL 4 MG/2ML IJ SOLN: 4.0000 mg | Freq: Four times a day (QID) | INTRAMUSCULAR | Status: DC | PRN

## 2018-12-31 MED ORDER — ASPIRIN 300 MG RE SUPP: 300.0000 mg | RECTAL | Status: AC

## 2018-12-31 MED ORDER — SODIUM CHLORIDE 0.9% FLUSH
3.0000 mL | Freq: Two times a day (BID) | INTRAVENOUS | Status: DC
  Administered 2018-12-31: 3 mL via INTRAVENOUS

## 2018-12-31 MED ORDER — ATORVASTATIN CALCIUM 40 MG PO TABS
40.0000 mg | ORAL_TABLET | Freq: Every day | ORAL | Status: DC
  Filled 2018-12-31 (×2): qty 1

## 2018-12-31 MED ORDER — NITROGLYCERIN 0.4 MG SL SUBL: 0.4000 mg | SUBLINGUAL_TABLET | SUBLINGUAL | Status: DC | PRN

## 2018-12-31 MED ORDER — CARVEDILOL 3.125 MG PO TABS
3.1250 mg | ORAL_TABLET | Freq: Two times a day (BID) | ORAL | Status: DC
  Administered 2018-12-31 – 2019-01-01 (×3): 3.125 mg via ORAL
  Filled 2018-12-31 (×4): qty 1

## 2018-12-31 MED ORDER — SODIUM CHLORIDE 0.9 % WEIGHT BASED INFUSION
1.0000 mL/kg/h | INTRAVENOUS | Status: DC
  Administered 2019-01-01: 1 mL/kg/h via INTRAVENOUS

## 2018-12-31 MED ORDER — POTASSIUM CHLORIDE CRYS ER 20 MEQ PO TBCR
40.0000 meq | EXTENDED_RELEASE_TABLET | Freq: Once | ORAL | Status: AC
  Administered 2018-12-31: 40 meq via ORAL
  Filled 2018-12-31: qty 2

## 2018-12-31 MED ORDER — SODIUM CHLORIDE 0.9 % IV SOLN
INTRAVENOUS | Status: DC
  Administered 2018-12-31: 18:00:00 via INTRAVENOUS

## 2018-12-31 MED ORDER — ASPIRIN 81 MG PO CHEW
243.0000 mg | CHEWABLE_TABLET | ORAL | Status: AC
  Administered 2018-12-31: 243 mg via ORAL
  Filled 2018-12-31: qty 3

## 2018-12-31 MED ORDER — ACETAMINOPHEN 325 MG PO TABS: 650.0000 mg | ORAL_TABLET | ORAL | Status: DC | PRN

## 2018-12-31 MED ORDER — NITROGLYCERIN 0.4 MG SL SUBL
0.4000 mg | SUBLINGUAL_TABLET | SUBLINGUAL | Status: DC | PRN
  Administered 2018-12-31 (×2): 0.4 mg via SUBLINGUAL
  Filled 2018-12-31: qty 1

## 2018-12-31 NOTE — Progress Notes (Signed)
ANTICOAGULATION CONSULT NOTE - Initial Consult  Pharmacy Consult for heparin Indication: chest pain/ACS  Allergies  Allergen Reactions  . Ferrous Sulfate Other (See Comments)    Face burning  . Meclizine Other (See Comments)    Burning in face when taking iron  . Metoprolol Other (See Comments)    abd pain  . Pravastatin Other (See Comments)    Myalgias, even when combined with coenzyme q 10  . Ticagrelor Other (See Comments)    Cough, sneezing and nasal congestion  . Lisinopril Cough  . Penicillins Hives and Rash    Did it involve swelling of the face/tongue/throat, SOB, or low BP? No Did it involve sudden or severe rash/hives, skin peeling, or any reaction on the inside of your mouth or nose? No Did you need to seek medical attention at a hospital or doctor's office? Yes When did it last happen?40 years If all above answers are "NO", may proceed with cephalosporin use.    Patient Measurements: Height: 5\' 8"  (172.7 cm) Weight: 200 lb (90.7 kg) IBW/kg (Calculated) : 68.4 Heparin Dosing Weight: 87.1 kg  Vital Signs: Temp: 98.3 F (36.8 C) (03/05 1125) Temp Source: Oral (03/05 1125) BP: 122/81 (03/05 1621) Pulse Rate: 75 (03/05 1621)  Labs: Recent Labs    12/31/18 1133  HGB 13.6  HCT 43.3  PLT 325  CREATININE 0.98  TROPONINI <0.03    Estimated Creatinine Clearance: 65.7 mL/min (by C-G formula based on SCr of 0.98 mg/dL).   Medical History: Past Medical History:  Diagnosis Date  . Acute lower GI bleeding 2015   "related to blood thinners"  . Anemia   . Bursitis of both shoulders   . CAD (coronary artery disease)    a. s/p PCI 01/17/2011 85% proximal RCA with DES, PCI 09/05/2014 60% mid LAD lesion, with DES  b. Cath 02/13/2016 20% ost RCA, 20% D1, 80% ost OM2 treated with 2.7514 mm Resolute DES  . HTN (hypertension)    "not anymore" (02/12/2016)  . Hyperlipidemia   . Pneumonia    "double"  . Tuberculosis    "I've got 25% somewhere in my body"  (02/12/2016)    Medications:  Scheduled:  . aspirin  243 mg Oral NOW   Or  . aspirin  300 mg Rectal NOW  . [START ON 01/01/2019] aspirin EC  81 mg Oral Daily  . atorvastatin  40 mg Oral q1800  . carvedilol  3.125 mg Oral BID WC  . heparin  4,000 Units Intravenous Once  . potassium chloride  40 mEq Oral Once   Infusions:  . sodium chloride    . heparin      Assessment: 82 yoM admitted 3/5 with complaints of chest pain and SOB. Troponin on admission negative. Patient CBC at baseline stable, Hgb 13.6, Hct 43.3 and Pltc 325. Patient not on anticoagulation PTA.   Goal of Therapy:  Heparin level 0.3-0.7 units/ml Monitor platelets by anticoagulation protocol: Yes   Plan:  Give 4000 units bolus x 1 Start heparin infusion at 1150 units/hr Check anti-Xa level in 8 hours and daily while on heparin Continue to monitor H&H and platelets  Thank you for allowing pharmacy to be a part of this patient's care.  Lenord Carbo, PharmD PGY1 Pharmacy Resident  Please check AMION for all [facility] Pharmacy phone numbers 12/31/2018,5:17 PM

## 2018-12-31 NOTE — ED Notes (Signed)
ED TO INPATIENT HANDOFF REPORT  ED Nurse Name and Phone #: Kellye Mizner [phone]  S Name/Age/Gender Allen Terry 83 y.o. male Room/Bed: 023C/023C  Code Status   Code Status: Full Code  Home/SNF/Other Home Patient oriented to: self, place, time and situation Is this baseline? Yes   Triage Complete: Triage complete  Chief Complaint cp  Triage Note C/o non radiating left sided chest pain/sob/ and productive cough  that has been going on x 1 week ; pt denies any fevers    Allergies Allergies  Allergen Reactions  . Ferrous Sulfate Other (See Comments)    Face burning  . Meclizine Other (See Comments)    Burning in face when taking iron  . Metoprolol Other (See Comments)    abd pain  . Pravastatin Other (See Comments)    Myalgias, even when combined with coenzyme q 10  . Ticagrelor Other (See Comments)    Cough, sneezing and nasal congestion  . Lisinopril Cough  . Penicillins Hives and Rash    Did it involve swelling of the face/tongue/throat, SOB, or low BP? No Did it involve sudden or severe rash/hives, skin peeling, or any reaction on the inside of your mouth or nose? No Did you need to seek medical attention at a hospital or doctor's office? Yes When did it last happen?40 years If all above answers are "NO", may proceed with cephalosporin use.    Level of Care/Admitting Diagnosis ED Disposition    ED Disposition Condition Comment   Admit  Hospital Area: [facility] [[id number]]  Level of Care: Progressive [102]  Diagnosis: Unstable angina [facility]) [[id number]]  Admitting Physician: Sabas Sous  Attending Physician: Vesta Mixer [phone]  Estimated length of stay: past midnight tomorrow  Certification:: I certify this patient will need inpatient services for at least 2 midnights  PT Class (Do Not Modify): Inpatient [101]  PT Acc Code (Do Not Modify): Private [1]       B Medical/Surgery History Past Medical History:  Diagnosis Date  .  Acute lower GI bleeding 2015   "related to blood thinners"  . Anemia   . Bursitis of both shoulders   . CAD (coronary artery disease)    a. s/p PCI 01/17/2011 85% proximal RCA with DES, PCI 09/05/2014 60% mid LAD lesion, with DES  b. Cath 02/13/2016 20% ost RCA, 20% D1, 80% ost OM2 treated with 2.7514 mm Resolute DES  . HTN (hypertension)    "not anymore" (02/12/2016)  . Hyperlipidemia   . Pneumonia    "double"  . Tuberculosis    "I've got 25% somewhere in my body" (02/12/2016)   Past Surgical History:  Procedure Laterality Date  . CARDIAC CATHETERIZATION N/A 02/13/2016   Procedure: Left Heart Cath and Coronary Angiography;  Surgeon: Lennette Bihari, MD;  Location: [facility] INVASIVE CV LAB;  Service: Cardiovascular;  Laterality: N/A;  . CARDIAC CATHETERIZATION N/A 02/13/2016   Procedure: Coronary Stent Intervention;  Surgeon: Lennette Bihari, MD;  Location: [facility] INVASIVE CV LAB;  Service: Cardiovascular;  Laterality: N/A;  . CATARACT EXTRACTION W/ INTRAOCULAR LENS  IMPLANT, BILATERAL Bilateral   . CORONARY ANGIOPLASTY WITH STENT PLACEMENT     s/p PCI 01/17/2011 85% proximal RCA with DES, PCI 09/05/2014 60% mid LAD lesion, with DES   . TONSILLECTOMY    . UPPER GI ENDOSCOPY     s/p GI bleed related to [place]     A IV Location/Drains/Wounds Patient Lines/Drains/Airways Status   Active Line/Drains/Airways  Name:   Placement date:   Placement time:   Site:   Days:   Peripheral IV 12/31/18 Left Antecubital   12/31/18    1134    Antecubital   less than 1          Intake/Output Last 24 hours No intake or output data in the 24 hours ending 12/31/18 1813  Labs/Imaging Results for orders placed or performed during the hospital encounter of 12/31/18 (from the past 48 hour(s))  Basic metabolic panel     Status: Abnormal   Collection Time: 12/31/18 11:33 AM  Result Value Ref Range   Sodium 135 135 - 145 mmol/L   Potassium 3.1 (L) 3.5 - 5.1 mmol/L   Chloride 100 98 - 111 mmol/L   CO2 23 22 - 32  mmol/L   Glucose, Bld 132 (H) 70 - 99 mg/dL   BUN 8 8 - 23 mg/dL   Creatinine, Ser 3.41 0.61 - 1.24 mg/dL   Calcium 9.1 8.9 - 93.7 mg/dL   GFR calc non Af Amer >60 >60 mL/min   GFR calc Af Amer >60 >60 mL/min   Anion gap 12 5 - 15    Comment: Performed at [facility], [address], [place], [place] [address]  Troponin I - Once     Status: None   Collection Time: 12/31/18 11:33 AM  Result Value Ref Range   Troponin I <0.03 <0.03 ng/mL    Comment: Performed at [facility], [address], [place], [place] [address]  CBC with Differential     Status: Abnormal   Collection Time: 12/31/18 11:33 AM  Result Value Ref Range   WBC 7.9 4.0 - 10.5 K/uL   RBC 5.29 4.22 - 5.81 MIL/uL   Hemoglobin 13.6 13.0 - 17.0 g/dL   HCT 29.9 24.2 - 68.3 %   MCV 81.9 80.0 - 100.0 fL   MCH 25.7 (L) 26.0 - 34.0 pg   MCHC 31.4 30.0 - 36.0 g/dL   RDW 41.9 62.2 - 29.7 %   Platelets 325 150 - 400 K/uL   nRBC 0.0 0.0 - 0.2 %   Neutrophils Relative % 63 %   Neutro Abs 4.9 1.7 - 7.7 K/uL   Lymphocytes Relative 24 %   Lymphs Abs 1.9 0.7 - 4.0 K/uL   Monocytes Relative 10 %   Monocytes Absolute 0.8 0.1 - 1.0 K/uL   Eosinophils Relative 2 %   Eosinophils Absolute 0.2 0.0 - 0.5 K/uL   Basophils Relative 1 %   Basophils Absolute 0.1 0.0 - 0.1 K/uL   Immature Granulocytes 0 %   Abs Immature Granulocytes 0.03 0.00 - 0.07 K/uL    Comment: Performed at [facility], [address] [place], [place] [address]  Hemoglobin A1c     Status: Abnormal   Collection Time: 12/31/18  5:12 PM  Result Value Ref Range   Hgb A1c MFr Bld 6.2 (H) 4.8 - 5.6 %    Comment: (NOTE) Pre diabetes:          5.7%-6.4% Diabetes:              >6.4% Glycemic control for   <7.0% adults with diabetes    Mean Plasma Glucose 131.24 mg/dL    Comment: Performed at [facility], [address] [place], [place] [address]   Dg Chest 2 View  Result Date: 12/31/2018 CLINICAL DATA:  non radiating left sided chest  pain/sob/ and productive cough that  has been going on x 1 week ; chest pain EXAM: CHEST - 2 VIEW COMPARISON:  None. FINDINGS: Normal mediastinum and cardiac silhouette. Normal pulmonary vasculature. No evidence of effusion, infiltrate, or pneumothorax. No acute bony abnormality. Degenerative osteophytosis of the spine. IMPRESSION: No acute cardiopulmonary process. Electronically Signed   By: Genevive Bi M.D.   On: 12/31/2018 13:08    Pending Labs Unresulted Labs (From admission, onward)    Start     Ordered   01/02/19 0500  Heparin level (unfractionated)  Daily,   R     12/31/18 1727   01/01/19 0500  Basic metabolic panel  Tomorrow morning,   R     12/31/18 1714   01/01/19 0500  Lipid panel  Tomorrow morning,   R     12/31/18 1714   01/01/19 0500  CBC  Tomorrow morning,   R     12/31/18 1714   01/01/19 0500  Protime-INR  Tomorrow morning,   R     12/31/18 1714   01/01/19 0500  CBC  Daily,   R     12/31/18 1727   01/01/19 0100  Heparin level (unfractionated)  Once-Timed,   R     12/31/18 1727   12/31/18 1712  Magnesium  Once,   R     12/31/18 1714   12/31/18 1712  TSH  Once,   R     12/31/18 1714   12/31/18 1712  T4, free  Once,   R     12/31/18 1714   12/31/18 1712  Troponin I - Now Then Q6H  Now then every 6 hours,   STAT     12/31/18 1714          Vitals/Pain Today's Vitals   12/31/18 1342 12/31/18 1621 12/31/18 1624 12/31/18 1810  BP: 128/74 122/81  (!) 159/79  Pulse: 76 75  70  Resp: 14 (!) 23  18  Temp:      TempSrc:      SpO2: 94% 99%  97%  Weight:      Height:      PainSc: 0-No pain  0-No pain     Isolation Precautions No active isolations  Medications Medications  nitroGLYCERIN (NITROSTAT) SL tablet 0.4 mg (0.4 mg Sublingual Given 12/31/18 1327)  aspirin EC tablet 81 mg (has no administration in time range)  acetaminophen (TYLENOL) tablet 650 mg (has no administration in time range)  ondansetron (ZOFRAN) injection 4 mg (has no administration in time  range)  0.9 %  sodium chloride infusion ( Intravenous New Bag/Given 12/31/18 1750)  atorvastatin (LIPITOR) tablet 40 mg (40 mg Oral Not Given 12/31/18 1758)  carvedilol (COREG) tablet 3.125 mg (has no administration in time range)  heparin ADULT infusion 100 units/mL (25000 units/234mL sodium chloride 0.45%) (1,150 Units/hr Intravenous New Bag/Given 12/31/18 1754)  aspirin chewable tablet 243 mg (243 mg Oral Given 12/31/18 1741)    Or  aspirin suppository 300 mg ( Rectal See Alternative 12/31/18 1741)  potassium chloride SA (K-DUR,KLOR-CON) CR tablet 40 mEq (40 mEq Oral Given 12/31/18 1741)  heparin bolus via infusion 4,000 Units (4,000 Units Intravenous Bolus from Bag 12/31/18 1754)    Mobility walks Low fall risk   Focused Assessments Cardiac Assessment Handoff:  Cardiac Rhythm: Normal sinus rhythm Lab Results  Component Value Date   TROPONINI <0.03 12/31/2018   No results found for: DDIMER Does the Patient currently have chest pain? No     R Recommendations: See Admitting Provider Note  Report given to:   Additional Notes:

## 2018-12-31 NOTE — ED Triage Notes (Signed)
C/o non radiating left sided chest pain/sob/ and productive cough  that has been going on x 1 week ; pt denies any fevers

## 2018-12-31 NOTE — ED Provider Notes (Signed)
[facility] EMERGENCY DEPARTMENT Provider Note   CSN: [id number] Arrival date & time: 12/31/18  1116    History   Chief Complaint Chief Complaint  Allen presents with  . Chest Pain  . Shortness of Breath    HPI Allen Terry is a 83 y.o. male.     HPI Allen Terry with chest pain.  Has had for the last week.  Occasional cough.  States no real production however.  States there is not been much of a cough.  States there is a chest tightness that feels like his previous angina.  States he feels as if he needs another stent.  States with exertion his chest pain and shortness of breath get worse.  States he would not be able to walk to the door right now.  No swelling in his legs.  States he has had previous stents.  Has not taken any nitroglycerin at home. Past Medical History:  Diagnosis Date  . Acute lower GI bleeding 2015   "related to blood thinners"  . Anemia   . Bursitis of both shoulders   . CAD (coronary artery disease)    a. s/p PCI 01/17/2011 85% proximal RCA with DES, PCI 09/05/2014 60% mid LAD lesion, with DES  b. Cath 02/13/2016 20% ost RCA, 20% D1, 80% ost OM2 treated with 2.7514 mm Resolute DES  . HTN (hypertension)    "not anymore" (02/12/2016)  . Hyperlipidemia   . Pneumonia    "double"  . Tuberculosis    "I've got 25% somewhere in my body" (02/12/2016)    Allen Active Problem List   Diagnosis Date Noted  . Hyperlipidemia   . HTN (hypertension)   . CAD in native artery   . DOE (dyspnea on exertion)   . Unstable angina (HCC) 02/12/2016    Past Surgical History:  Procedure Laterality Date  . CARDIAC CATHETERIZATION N/A 02/13/2016   Procedure: Left Heart Cath and Coronary Angiography;  Surgeon: Lennette Bihari, MD;  [facility];  Service: Cardiovascular;  Laterality: N/A;  . CARDIAC CATHETERIZATION N/A 02/13/2016   Procedure: Coronary Stent Intervention;  Surgeon: Lennette Bihari, MD;  Location: [facility] INVASIVE CV LAB;   Service: Cardiovascular;  Laterality: N/A;  . CATARACT EXTRACTION W/ INTRAOCULAR LENS  IMPLANT, BILATERAL Bilateral   . CORONARY ANGIOPLASTY WITH STENT PLACEMENT     s/p PCI 01/17/2011 85% proximal RCA with DES, PCI 09/05/2014 60% mid LAD lesion, with DES   . TONSILLECTOMY    . UPPER GI ENDOSCOPY     s/p GI bleed related to [facility] Medications    Prior to Admission medications   Medication Sig Start Date End Date Taking? Authorizing Provider  aspirin EC 81 MG tablet Take 81 mg by mouth daily.    Yes [provider]  hydrochlorothiazide (HYDRODIURIL) 25 MG tablet Take 25 mg by mouth daily.   Yes [provider]  ibuprofen (ADVIL,MOTRIN) 600 MG tablet Take 1 tablet (600 mg total) by mouth every 8 (eight) hours as needed for mild pain or moderate pain. 06/26/16  Yes Rockne Menghini, MD  nitroGLYCERIN (NITROSTAT) 0.4 MG SL tablet Place 1 tablet (0.4 mg total) under the tongue every 5 (five) minutes x 3 doses as needed for chest pain. 02/14/16  Yes Azalee Course, PA  tamsulosin (FLOMAX) 0.4 MG CAPS capsule Take 0.4 mg by mouth daily.   Yes [provider]  oxyCODONE-acetaminophen (ROXICET) 5-325 MG tablet  Take 1-2 tablets by mouth every 4 (four) hours as needed for severe pain. Allen not taking: Reported on 12/31/2018 06/26/16   Rockne Menghini, MD  prasugrel (EFFIENT) 10 MG TABS tablet Take 1 tablet (10 mg total) by mouth daily. Allen not taking: Reported on 12/31/2018 02/14/16   Azalee Course, PA    Family History Family History  Problem Relation Age of Onset  . Arrhythmia Mother   . Arrhythmia Sister   . Stroke Father   . Hypertension Brother     Social History Social History   Tobacco Use  . Smoking status: Former Smoker    Types: Cigarettes  . Smokeless tobacco: Never Used  . Tobacco comment: "quit smoking cigarettes in ~ 2014"  Substance Use Topics  . Alcohol use: Yes    Comment: "quit drinking in ~ 1984; did have a problem  w/alcohol at one time"  . Drug use: No     Allergies   Ferrous sulfate; Meclizine; Metoprolol; Pravastatin; Ticagrelor; Lisinopril; and Penicillins   Review of Systems Review of Systems  Constitutional: Negative for appetite change.  HENT: Negative for congestion.   Respiratory: Positive for cough and shortness of breath.   Cardiovascular: Positive for chest pain. Negative for leg swelling.  Gastrointestinal: Negative for abdominal pain.  Genitourinary: Negative for flank pain.  Musculoskeletal: Negative for back pain.  Skin: Negative for rash.  Neurological: Negative for seizures and weakness.  Psychiatric/Behavioral: Negative for confusion.     Physical Exam Updated Vital Signs BP 128/74   Pulse 76   Temp 98.3 F (36.8 C) (Oral)   Resp 14   Ht  (1.727 m)   Wt 90.7 kg   SpO2 94%   BMI 30.41 kg/m   Physical Exam HENT:     Head: Normocephalic.  Neck:     Musculoskeletal: Neck supple.  Cardiovascular:     Rate and Rhythm: Normal rate.     Heart sounds: Normal heart sounds.  Pulmonary:     Breath sounds: No decreased breath sounds, wheezing, rhonchi or rales.  Chest:     Chest wall: No deformity or crepitus.  Abdominal:     Tenderness: There is no abdominal tenderness.  Musculoskeletal:     Right lower leg: No edema.     Left lower leg: No edema.  Skin:    General: Skin is warm.     Capillary Refill: Capillary refill takes less than 2 seconds.  Neurological:     Mental Status: He is alert.      ED Treatments / Results  Labs (all labs ordered are listed, but only abnormal results are displayed) Labs Reviewed  BASIC METABOLIC PANEL - Abnormal; Notable for the following components:      Result Value   Potassium 3.1 (*)    Glucose, Bld 132 (*)    All other components within normal limits  CBC WITH DIFFERENTIAL/PLATELET - Abnormal; Notable for the following components:   MCH 25.7 (*)    All other components within normal limits  TROPONIN I     EKG EKG Interpretation  Date/Time:  Thursday December 31 2018 11:24:57 EST Ventricular Rate:  75 PR Interval:    QRS Duration: 124 QT Interval:  407 QTC Calculation: 440 R Axis:   57 Text Interpretation:  Sinus rhythm Atrial premature complexes in couplets Short PR interval IVCD, consider atypical RBBB Baseline wander in lead(s) V3 Confirmed by Benjiman Core [phone]) on 12/31/2018 11:30:16 AM   Radiology Dg Chest 2 View  Result  Date: 12/31/2018 CLINICAL DATA:  non radiating left sided chest pain/sob/ and productive cough that has been going on x 1 week ; chest pain EXAM: CHEST - 2 VIEW COMPARISON:  None. FINDINGS: Normal mediastinum and cardiac silhouette. Normal pulmonary vasculature. No evidence of effusion, infiltrate, or pneumothorax. No acute bony abnormality. Degenerative osteophytosis of the spine. IMPRESSION: No acute cardiopulmonary process. Electronically Signed   By: Genevive Bi M.D.   On: 12/31/2018 13:08    Procedures Procedures (including critical care time)  Medications Ordered in ED Medications  nitroGLYCERIN (NITROSTAT) SL tablet 0.4 mg (0.4 mg Sublingual Given 12/31/18 1327)     Initial Impression / Assessment and Plan / ED Course  I have reviewed the triage vital signs and the nursing notes.  Pertinent labs & imaging results that were available during my care of the Allen were reviewed by me and considered in my medical decision making (see chart for details).       Allen with shortness of breath.  States it feels like his previous heart pain.  Has had a little bit of a cough however no real sputum production x-ray reassuring and Allen's pain was relieved by nitroglycerin.  States he feels better after the nitro.  States however this is the same pain as his previous heart disease.  Will have Allen seen by cardiology.  Final Clinical Impressions(s) / ED Diagnoses   Final diagnoses:  Chest pain, unspecified type    ED Discharge Orders     None       Benjiman Core, MD 12/31/18 1418

## 2018-12-31 NOTE — ED Notes (Signed)
Called to see if nurse has read report handoff, Per floor, patient has not been approved yet since nurse that is going to be taking report is off the floor transporting another patient

## 2018-12-31 NOTE — H&P (Signed)
Cardiology Consultation:   Patient ID: Allen Terry MRN: [id number]; DOB: July 08, 1936  Admit date: 12/31/2018 Date of Consult: 12/31/2018  Primary Care Provider: System, Pcp Not In Primary Cardiologist: Dr. Overton Mam,  [facility] , [place] [facility] [place]. Primary Electrophysiologist:  None    Patient Profile:   Allen Terry is a 83 y.o. male with a hx of CAD ( followed by Dr. Hyacinth Meeker at [facility]  who is being seen today for the evaluation of  Chest pain  at the request of  Dr. Rubin Payor .  History of Present Illness:   Mr. Gentle has a hx of CAD Original  LAD stent in 2012  Had LCx stent in April 2017   Now - presents with 1-2 weeks of DOE,  Assoc with chest pressure . / tightness Would last for several minutes.   No radiation.  No diaphoresis .  Slightly presyncope   Feels similar to his previous episodes of angina   Typically  Walks 1 1/2 miles a day but for the last week has not been able to walk Has ntg at home but did not take  ( was old )   He has a history of hyperlipidemia.  He is tried multiple statins but did not tolerate them.  He refuses to try any additional statins. We discussed some of the newer lipid medicines including PCSK-9 inhibotors   + cough - he thinks its allergy  No fever, no sputum No blood in stool or urine ,  No urinary hesitancy  Has  blurry vision  No syncope No seizures.  No N/V/D    Past Medical History:  Diagnosis Date  . Acute lower GI bleeding 2015   "related to blood thinners"  . Anemia   . Bursitis of both shoulders   . CAD (coronary artery disease)    a. s/p PCI 01/17/2011 85% proximal RCA with DES, PCI 09/05/2014 60% mid LAD lesion, with DES  b. Cath 02/13/2016 20% ost RCA, 20% D1, 80% ost OM2 treated with 2.7514 mm Resolute DES  . HTN (hypertension)    "not anymore" (02/12/2016)  . Hyperlipidemia   . Pneumonia    "double"  . Tuberculosis    "I've got 25% somewhere in my body" (02/12/2016)    Past Surgical  History:  Procedure Laterality Date  . CARDIAC CATHETERIZATION N/A 02/13/2016   Procedure: Left Heart Cath and Coronary Angiography;  Surgeon: Lennette Bihari, MD;  Location: [facility] INVASIVE CV LAB;  Service: Cardiovascular;  Laterality: N/A;  . CARDIAC CATHETERIZATION N/A 02/13/2016   Procedure: Coronary Stent Intervention;  Surgeon: Lennette Bihari, MD;  Location: [facility] INVASIVE CV LAB;  Service: Cardiovascular;  Laterality: N/A;  . CATARACT EXTRACTION W/ INTRAOCULAR LENS  IMPLANT, BILATERAL Bilateral   . CORONARY ANGIOPLASTY WITH STENT PLACEMENT     s/p PCI 01/17/2011 85% proximal RCA with DES, PCI 09/05/2014 60% mid LAD lesion, with DES   . TONSILLECTOMY    . UPPER GI ENDOSCOPY     s/p GI bleed related to Brilenta     Home Medications:  Prior to Admission medications   Medication Sig Start Date End Date Taking? Authorizing Provider  aspirin EC 81 MG tablet Take 81 mg by mouth daily.    Yes [provider]  hydrochlorothiazide (HYDRODIURIL) 25 MG tablet Take 25 mg by mouth daily.   Yes [provider]  ibuprofen (ADVIL,MOTRIN) 600 MG tablet Take 1 tablet (600 mg total) by mouth every 8 (eight)  hours as needed for mild pain or moderate pain. 06/26/16  Yes Rockne Menghini, MD  nitroGLYCERIN (NITROSTAT) 0.4 MG SL tablet Place 1 tablet (0.4 mg total) under the tongue every 5 (five) minutes x 3 doses as needed for chest pain. 02/14/16  Yes Azalee Course, PA  tamsulosin (FLOMAX) 0.4 MG CAPS capsule Take 0.4 mg by mouth daily.   Yes [provider]  oxyCODONE-acetaminophen (ROXICET) 5-325 MG tablet Take 1-2 tablets by mouth every 4 (four) hours as needed for severe pain. Patient not taking: Reported on 12/31/2018 06/26/16   Rockne Menghini, MD  prasugrel (EFFIENT) 10 MG TABS tablet Take 1 tablet (10 mg total) by mouth daily. Patient not taking: Reported on 12/31/2018 02/14/16   Azalee Course, PA    Inpatient Medications: Scheduled Meds:  Continuous Infusions:  PRN  Meds: nitroGLYCERIN  Allergies:    Allergies  Allergen Reactions  . Ferrous Sulfate Other (See Comments)    Face burning  . Meclizine Other (See Comments)    Burning in face when taking iron  . Metoprolol Other (See Comments)    abd pain  . Pravastatin Other (See Comments)    Myalgias, even when combined with coenzyme q 10  . Ticagrelor Other (See Comments)    Cough, sneezing and nasal congestion  . Lisinopril Cough  . Penicillins Hives and Rash    Did it involve swelling of the face/tongue/throat, SOB, or low BP? No Did it involve sudden or severe rash/hives, skin peeling, or any reaction on the inside of your mouth or nose? No Did you need to seek medical attention at a hospital or doctor's office? Yes When did it last happen?40 years If all above answers are "NO", may proceed with cephalosporin use.    Social History:   Social History   Socioeconomic History  . Marital status: Divorced    Spouse name: Not on file  . Number of children: Not on file  . Years of education: Not on file  . Highest education level: Not on file  Occupational History  . Not on file  Social Needs  . Financial resource strain: Not on file  . Food insecurity:    Worry: Not on file    Inability: Not on file  . Transportation needs:    Medical: Not on file    Non-medical: Not on file  Tobacco Use  . Smoking status: Former Smoker    Types: Cigarettes  . Smokeless tobacco: Never Used  . Tobacco comment: "quit smoking cigarettes in ~ 2014"  Substance and Sexual Activity  . Alcohol use: Yes    Comment: "quit drinking in ~ 1984; did have a problem w/alcohol at one time"  . Drug use: No  . Sexual activity: Not Currently  Lifestyle  . Physical activity:    Days per week: Not on file    Minutes per session: Not on file  . Stress: Not on file  Relationships  . Social connections:    Talks on phone: Not on file    Gets together: Not on file    Attends religious service: Not on file     Active member of club or organization: Not on file    Attends meetings of clubs or organizations: Not on file    Relationship status: Not on file  . Intimate partner violence:    Fear of current or ex partner: Not on file    Emotionally abused: Not on file    Physically abused: Not on file  Forced sexual activity: Not on file  Other Topics Concern  . Not on file  Social History Narrative  . Not on file    Family History:    Family History  Problem Relation Age of Onset  . Arrhythmia Mother   . Arrhythmia Sister   . Stroke Father   . Hypertension Brother      ROS:  Please see the history of present illness.   All other ROS reviewed and negative.     Physical Exam/Data:   Vitals:   12/31/18 1304 12/31/18 1326 12/31/18 1342 12/31/18 1621  BP: 126/72 125/73 128/74 122/81  Pulse: 69 76 76 75  Resp: 18 17 14  (!) 23  Temp:      TempSrc:      SpO2: 96% 95% 94% 99%  Weight:      Height:       No intake or output data in the 24 hours ending 12/31/18 1642 Last 3 Weights 12/31/2018 06/26/2016 02/12/2016  Weight (lbs) 200 lb 200 lb 203 lb 8 oz  Weight (kg) 90.719 kg 90.719 kg 92.307 kg     Body mass index is 30.41 kg/m.  General:   Elderly male,  NAD  HEENT: normal Lymph: no adenopathy Neck: no JVD Endocrine:  No thryomegaly Vascular: No carotid bruits; FA pulses 2+ bilaterally without bruits  Cardiac:  normal S1, S2; RRR; no murmur  Lungs:  clear to auscultation bilaterally, no wheezing, rhonchi or rales  Abd: soft, nontender, no hepatomegaly  Ext: no edema Musculoskeletal:  No deformities, BUE and BLE strength normal and equal Skin: warm and dry  Neuro:  CNs 2-12 intact, no focal abnormalities noted Psych:  Normal affect   EKG:    NSR , no acute ST changes    Telemetry:  Telemetry was personally reviewed and demonstrates:   NSR   Relevant CV Studies:   Laboratory Data:  Chemistry Recent Labs  Lab 12/31/18 1133  NA 135  K 3.1*  CL 100  CO2 23  GLUCOSE  132*  BUN 8  CREATININE 0.98  CALCIUM 9.1  GFRNONAA >60  GFRAA >60  ANIONGAP 12    No results for input(s): PROT, ALBUMIN, AST, ALT, ALKPHOS, BILITOT in the last 168 hours. Hematology Recent Labs  Lab 12/31/18 1133  WBC 7.9  RBC 5.29  HGB 13.6  HCT 43.3  MCV 81.9  MCH 25.7*  MCHC 31.4  RDW 14.5  PLT 325   Cardiac Enzymes Recent Labs  Lab 12/31/18 1133  TROPONINI <0.03   No results for input(s): TROPIPOC in the last 168 hours.  BNPNo results for input(s): BNP, PROBNP in the last 168 hours.  DDimer No results for input(s): DDIMER in the last 168 hours.  Radiology/Studies:  Dg Chest 2 View  Result Date: 12/31/2018 CLINICAL DATA:  non radiating left sided chest pain/sob/ and productive cough that has been going on x 1 week ; chest pain EXAM: CHEST - 2 VIEW COMPARISON:  None. FINDINGS: Normal mediastinum and cardiac silhouette. Normal pulmonary vasculature. No evidence of effusion, infiltrate, or pneumothorax. No acute bony abnormality. Degenerative osteophytosis of the spine. IMPRESSION: No acute cardiopulmonary process. Electronically Signed   By: Genevive Bi M.D.   On: 12/31/2018 13:08    Assessment and Plan:   1. Unstable angina: Patient presents with a week or 2 of progressive shortness of breath with exertion.  These episodes are associated with chest tightness.  Originally they occurred when he was walking but more recently they have  been occurring at rest.  He typically walks 1/2 miles a day but has not been able to walk this week because of this chest tightness and shortness of breath.  He states that this feels exactly like his episodes of angina prior to his last stent.  He has nitroglycerin at home but they are very old and he did not take any.  He presented to the emergency room for further evaluation.  Troponin levels are negative.  His EKG is nonacute.  I agree that his symptoms are concerning.  We will start him on IV heparin.  He is not having any pain at  present but we would have a low threshold to start him on nitroglycerin.  We have arranged for him to have a heart catheterization tomorrow.  We discussed the risks, benefits, options.  He understands and agrees to proceed.  2.  Hyperlipidemia:  Has tried statins and did not tolerate them We discuss PCSK9 inhibitors  He is unwilling to take statins         For questions or updates, please contact [facility] Please consult [email or web address] for contact info under      Kristeen Miss, MD  12/31/2018 4:42 PM

## 2018-12-31 NOTE — Plan of Care (Signed)
  Problem: Education: Goal: Individualized Educational Video(s) Outcome: Progressing   Problem: Activity: Goal: Ability to return to baseline activity level will improve Outcome: Progressing   Problem: Cardiovascular: Goal: Ability to achieve and maintain adequate cardiovascular perfusion will improve Outcome: Progressing   Problem: Health Behavior/Discharge Planning: Goal: Ability to manage health-related needs will improve Outcome: Progressing   Problem: Clinical Measurements: Goal: Ability to maintain clinical measurements within normal limits will improve Outcome: Progressing Goal: Will remain free from infection Outcome: Progressing Goal: Diagnostic test results will improve Outcome: Progressing Goal: Respiratory complications will improve Outcome: Progressing Goal: Cardiovascular complication will be avoided Outcome: Progressing   Problem: Nutrition: Goal: Adequate nutrition will be maintained Outcome: Progressing   Problem: Coping: Goal: Level of anxiety will decrease Outcome: Progressing   Problem: Elimination: Goal: Will not experience complications related to bowel motility Outcome: Progressing Goal: Will not experience complications related to urinary retention Outcome: Progressing   Problem: Pain Managment: Goal: General experience of comfort will improve Outcome: Progressing   Problem: Safety: Goal: Ability to remain free from injury will improve Outcome: Progressing   Problem: Skin Integrity: Goal: Risk for impaired skin integrity will decrease Outcome: Progressing

## 2019-01-01 ENCOUNTER — Encounter (HOSPITAL_COMMUNITY)
Admission: EM | Disposition: A | Discharge status: Home / Self Care | Payer: Self-pay | Source: Home / Self Care | Attending: Cardiovascular Disease

## 2019-01-01 ENCOUNTER — Encounter (HOSPITAL_COMMUNITY): Payer: Self-pay | Admitting: Interventional Cardiology

## 2019-01-01 DIAGNOSIS — I25110 Atherosclerotic heart disease of native coronary artery with unstable angina pectoris: Principal | ICD-10-CM

## 2019-01-01 DIAGNOSIS — E782 Mixed hyperlipidemia: Secondary | ICD-10-CM

## 2019-01-01 HISTORY — PX: CORONARY STENT INTERVENTION: CATH118234

## 2019-01-01 HISTORY — PX: LEFT HEART CATH AND CORONARY ANGIOGRAPHY: CATH118249

## 2019-01-01 LAB — BASIC METABOLIC PANEL
Anion gap: 6 (ref 5–15)
Anion gap: 9 (ref 5–15)
BUN: 12 mg/dL (ref 8–23)
BUN: 9 mg/dL (ref 8–23)
CALCIUM: 8.5 mg/dL — AB (ref 8.9–10.3)
CO2: 26 mmol/L (ref 22–32)
CO2: 22 mmol/L (ref 22–32)
Calcium: 8.6 mg/dL — ABNORMAL LOW (ref 8.9–10.3)
Chloride: 100 mmol/L (ref 98–111)
Chloride: 104 mmol/L (ref 98–111)
Creatinine, Ser: 1.18 mg/dL (ref 0.61–1.24)
Creatinine, Ser: 1 mg/dL (ref 0.61–1.24)
GFR calc Af Amer: >60 mL/min (ref >60)
GFR calc Af Amer: >60 mL/min (ref >60)
GFR calc non Af Amer: >60 mL/min (ref >60)
GFR, EST NON AFRICAN AMERICAN: 58 mL/min — AB (ref >60)
GLUCOSE: 124 mg/dL — AB (ref 70–99)
Glucose, Bld: 123 mg/dL — ABNORMAL HIGH (ref 70–99)
Potassium: 7.4 mmol/L (ref 3.5–5.1)
Potassium: 3.2 mmol/L — ABNORMAL LOW (ref 3.5–5.1)
Sodium: 132 mmol/L — ABNORMAL LOW (ref 135–145)
Sodium: 135 mmol/L (ref 135–145)

## 2019-01-01 LAB — LIPID PANEL
Cholesterol: 138 mg/dL (ref 0–200)
HDL: 16 mg/dL — ABNORMAL LOW (ref >40)
LDL Cholesterol: 63 mg/dL (ref 0–99)
Total CHOL/HDL Ratio: 8.6 RATIO
Triglycerides: 296 mg/dL — ABNORMAL HIGH (ref <150)
VLDL: 59 mg/dL — ABNORMAL HIGH (ref 0–40)

## 2019-01-01 LAB — POCT ACTIVATED CLOTTING TIME
ACTIVATED CLOTTING TIME: 285 s
Activated Clotting Time: 345 seconds

## 2019-01-01 LAB — PROTIME-INR
INR: 1.2 (ref 0.8–1.2)
Prothrombin Time: 14.8 seconds (ref 11.4–15.2)

## 2019-01-01 LAB — CBC
HEMATOCRIT: 39.5 % (ref 39.0–52.0)
Hemoglobin: 13.2 g/dL (ref 13.0–17.0)
MCH: 27.2 pg (ref 26.0–34.0)
MCHC: 33.4 g/dL (ref 30.0–36.0)
MCV: 81.4 fL (ref 80.0–100.0)
Platelets: 348 10*3/uL (ref 150–400)
RBC: 4.85 MIL/uL (ref 4.22–5.81)
RDW: 14.8 % (ref 11.5–15.5)
WBC: 11.1 10*3/uL — AB (ref 4.0–10.5)
nRBC: 0 % (ref 0.0–0.2)

## 2019-01-01 LAB — TROPONIN I: Troponin I: <0.03 ng/mL (ref <0.03)

## 2019-01-01 LAB — HEPARIN LEVEL (UNFRACTIONATED): Heparin Unfractionated: <0.1 IU/mL — ABNORMAL LOW (ref 0.30–0.70)

## 2019-01-01 SURGERY — LEFT HEART CATH AND CORONARY ANGIOGRAPHY
Anesthesia: LOCAL

## 2019-01-01 MED ORDER — FENTANYL CITRATE (PF) 100 MCG/2ML IJ SOLN
INTRAMUSCULAR | Status: AC
  Filled 2019-01-01: qty 2

## 2019-01-01 MED ORDER — SODIUM CHLORIDE 0.9 % IV SOLN: 250.0000 mL | INTRAVENOUS | Status: DC | PRN

## 2019-01-01 MED ORDER — CLOPIDOGREL BISULFATE 300 MG PO TABS
ORAL_TABLET | ORAL | Status: DC | PRN
  Administered 2019-01-01: 600 mg via ORAL

## 2019-01-01 MED ORDER — LIDOCAINE HCL (PF) 1 % IJ SOLN
INTRAMUSCULAR | Status: AC
  Filled 2019-01-01: qty 30

## 2019-01-01 MED ORDER — ONDANSETRON HCL 4 MG/2ML IJ SOLN: 4.0000 mg | Freq: Four times a day (QID) | INTRAMUSCULAR | Status: DC | PRN

## 2019-01-01 MED ORDER — PRASUGREL HCL 10 MG PO TABS
10.0000 mg | ORAL_TABLET | Freq: Every day | ORAL | Status: DC
  Administered 2019-01-02: 10 mg via ORAL
  Filled 2019-01-01: qty 1

## 2019-01-01 MED ORDER — HEPARIN SODIUM (PORCINE) 1000 UNIT/ML IJ SOLN
INTRAMUSCULAR | Status: AC
  Filled 2019-01-01: qty 1

## 2019-01-01 MED ORDER — SODIUM CHLORIDE 0.9% FLUSH
3.0000 mL | Freq: Two times a day (BID) | INTRAVENOUS | Status: DC
  Administered 2019-01-01 – 2019-01-02 (×2): 3 mL via INTRAVENOUS

## 2019-01-01 MED ORDER — ASPIRIN EC 81 MG PO TBEC
81.0000 mg | DELAYED_RELEASE_TABLET | Freq: Every day | ORAL | Status: DC
  Administered 2019-01-02: 81 mg via ORAL
  Filled 2019-01-01: qty 1

## 2019-01-01 MED ORDER — POTASSIUM CHLORIDE CRYS ER 20 MEQ PO TBCR
40.0000 meq | EXTENDED_RELEASE_TABLET | Freq: Once | ORAL | Status: AC
  Administered 2019-01-01: 40 meq via ORAL
  Filled 2019-01-01: qty 2

## 2019-01-01 MED ORDER — HEPARIN SODIUM (PORCINE) 1000 UNIT/ML IJ SOLN
INTRAMUSCULAR | Status: DC | PRN
  Administered 2019-01-01: 6000 [IU] via INTRAVENOUS
  Administered 2019-01-01: 4500 [IU] via INTRAVENOUS
  Administered 2019-01-01: 3000 [IU] via INTRAVENOUS

## 2019-01-01 MED ORDER — EZETIMIBE 10 MG PO TABS
10.0000 mg | ORAL_TABLET | Freq: Every day | ORAL | Status: DC
  Administered 2019-01-01: 10 mg via ORAL
  Filled 2019-01-01 (×2): qty 1

## 2019-01-01 MED ORDER — VERAPAMIL HCL 2.5 MG/ML IV SOLN
INTRAVENOUS | Status: AC
  Filled 2019-01-01: qty 2

## 2019-01-01 MED ORDER — MIDAZOLAM HCL 2 MG/2ML IJ SOLN
INTRAMUSCULAR | Status: AC
  Filled 2019-01-01: qty 2

## 2019-01-01 MED ORDER — HEPARIN BOLUS VIA INFUSION
3000.0000 [IU] | Freq: Once | INTRAVENOUS | Status: AC
  Administered 2019-01-01: 3000 [IU] via INTRAVENOUS
  Filled 2019-01-01: qty 3000

## 2019-01-01 MED ORDER — LABETALOL HCL 5 MG/ML IV SOLN: 10.0000 mg | INTRAVENOUS | Status: AC | PRN

## 2019-01-01 MED ORDER — HEPARIN (PORCINE) IN NACL 1000-0.9 UT/500ML-% IV SOLN
INTRAVENOUS | Status: DC | PRN
  Administered 2019-01-01 (×2): 500 mL

## 2019-01-01 MED ORDER — PRASUGREL HCL 10 MG PO TABS
60.0000 mg | ORAL_TABLET | Freq: Once | ORAL | Status: AC
  Administered 2019-01-01: 60 mg via ORAL
  Filled 2019-01-01: qty 6

## 2019-01-01 MED ORDER — CLOPIDOGREL BISULFATE 300 MG PO TABS
ORAL_TABLET | ORAL | Status: AC
  Filled 2019-01-01: qty 2

## 2019-01-01 MED ORDER — HEPARIN (PORCINE) IN NACL 1000-0.9 UT/500ML-% IV SOLN
INTRAVENOUS | Status: AC
  Filled 2019-01-01: qty 1000

## 2019-01-01 MED ORDER — CLOPIDOGREL BISULFATE 75 MG PO TABS: 75.0000 mg | ORAL_TABLET | Freq: Every day | ORAL | Status: DC

## 2019-01-01 MED ORDER — LIDOCAINE HCL (PF) 1 % IJ SOLN
INTRAMUSCULAR | Status: DC | PRN
  Administered 2019-01-01: 2 mL

## 2019-01-01 MED ORDER — VERAPAMIL HCL 2.5 MG/ML IV SOLN
INTRAVENOUS | Status: DC | PRN
  Administered 2019-01-01: 10 mL via INTRA_ARTERIAL

## 2019-01-01 MED ORDER — MIDAZOLAM HCL 2 MG/2ML IJ SOLN
INTRAMUSCULAR | Status: DC | PRN
  Administered 2019-01-01: 2 mg via INTRAVENOUS

## 2019-01-01 MED ORDER — HYDRALAZINE HCL 20 MG/ML IJ SOLN: 5.0000 mg | INTRAMUSCULAR | Status: AC | PRN

## 2019-01-01 MED ORDER — SODIUM CHLORIDE 0.9% FLUSH: 3.0000 mL | INTRAVENOUS | Status: DC | PRN

## 2019-01-01 MED ORDER — FENTANYL CITRATE (PF) 100 MCG/2ML IJ SOLN
INTRAMUSCULAR | Status: DC | PRN
  Administered 2019-01-01: 25 ug via INTRAVENOUS

## 2019-01-01 MED ORDER — SODIUM CHLORIDE 0.9 % IV SOLN
INTRAVENOUS | Status: AC
  Administered 2019-01-01: 14:00:00 via INTRAVENOUS

## 2019-01-01 MED ORDER — ACETAMINOPHEN 325 MG PO TABS: 650.0000 mg | ORAL_TABLET | ORAL | Status: DC | PRN

## 2019-01-01 MED ORDER — IOHEXOL 350 MG/ML SOLN
INTRAVENOUS | Status: DC | PRN
  Administered 2019-01-01: 80 mL

## 2019-01-01 SURGICAL SUPPLY — 18 items
BALLN SAPPHIRE 2.5X12 (BALLOONS) ×2
BALLN SAPPHIRE ~~LOC~~ 3.0X12 (BALLOONS) ×2 IMPLANT
BALLOON SAPPHIRE 2.5X12 (BALLOONS) ×1 IMPLANT
CATH 5FR JL3.5 JR4 ANG PIG MP (CATHETERS) ×2 IMPLANT
CATH LAUNCHER 6FR EBU3.5 (CATHETERS) ×2 IMPLANT
DEVICE RAD COMP TR BAND LRG (VASCULAR PRODUCTS) ×2 IMPLANT
ELECT DEFIB PAD ADLT CADENCE (PAD) ×2 IMPLANT
GLIDESHEATH SLEND SS 6F .021 (SHEATH) ×2 IMPLANT
GUIDEWIRE INQWIRE 1.5J.035X260 (WIRE) ×1 IMPLANT
INQWIRE 1.5J .035X260CM (WIRE) ×2
KIT ENCORE 26 ADVANTAGE (KITS) ×2 IMPLANT
KIT HEART LEFT (KITS) ×2 IMPLANT
KIT HEMO VALVE WATCHDOG (MISCELLANEOUS) ×2 IMPLANT
PACK CARDIAC CATHETERIZATION (CUSTOM PROCEDURE TRAY) ×2 IMPLANT
STENT SYNERGY DES 2.5X20 (Permanent Stent) ×2 IMPLANT
TRANSDUCER W/STOPCOCK (MISCELLANEOUS) ×2 IMPLANT
TUBING CIL FLEX 10 FLL-RA (TUBING) ×2 IMPLANT
WIRE ASAHI PROWATER 180CM (WIRE) ×2 IMPLANT

## 2019-01-01 NOTE — Progress Notes (Signed)
   Received a message that the patient takes prasugrel.  EPIC clearly states that the patient no longer takes this medicine.     Will reload with Prasugrel despite his age > 75.  THe patient stated that Plavix does not work for him and another medication "nearly killed" him (likely Brilinta).  He insisted on Effient.  Since there was a quesiton of whether he was taking it, we will reload him with Plavix.   Corky Crafts, MD

## 2019-01-01 NOTE — Progress Notes (Signed)
Progress Note  Patient Name: Allen Terry Date of Encounter: 01/01/2019  Primary Cardiologist: [place]  Subjective   83 year old gentleman with a history of coronary artery disease.  He has a stent in his LAD and also a stent in his left circumflex artery.  He presents with episodes of chest pain and shortness of breath with exertion.  His symptoms are exactly like his previous episodes of angina.  His troponin levels are negative. Potassium level was reported as 7.4 this am .   I suspect this is a lab error.   I have written for a repeat BMP  Mild chest tightness this am   Inpatient Medications    Scheduled Meds: . aspirin EC  81 mg Oral Daily  . atorvastatin  40 mg Oral q1800  . carvedilol  3.125 mg Oral BID WC  . sodium chloride flush  3 mL Intravenous Q12H  . tamsulosin  0.4 mg Oral Daily   Continuous Infusions: . sodium chloride 90 mL/hr at 01/01/19 0600  . sodium chloride    . sodium chloride 1 mL/kg/hr (01/01/19 0500)  . heparin 1,500 Units/hr (01/01/19 0809)   PRN Meds: sodium chloride, acetaminophen, nitroGLYCERIN, ondansetron (ZOFRAN) IV, sodium chloride flush, traZODone   Vital Signs    Vitals:   12/31/18 2000 12/31/18 2328 01/01/19 0320 01/01/19 0800  BP: (!) 156/87 127/63 126/67 (!) 145/69  Pulse: 87 64 60 (!) 59  Resp:  13 20 15   Temp: (!) 97.5 F (36.4 C) 98.2 F (36.8 C) 98.1 F (36.7 C) 97.7 F (36.5 C)  TempSrc: Oral Oral Oral Oral  SpO2: 98% 98% 98%   Weight: 93.2 kg     Height: 5\' 8"  (1.727 m)       Intake/Output Summary (Last 24 hours) at 01/01/2019 0818 Last data filed at 01/01/2019 0800 Gross per 24 hour  Intake 739.76 ml  Output 750 ml  Net -10.24 ml   Last 3 Weights 12/31/2018 12/31/2018 06/26/2016  Weight (lbs) 205 lb 7.5 oz 200 lb 200 lb  Weight (kg) 93.2 kg 90.719 kg 90.719 kg      Telemetry    NSR  - Personally Reviewed  ECG     NSR  - Personally Reviewed  Physical Exam   GEN:  elderly male.   NAD  Neck: No  JVD Cardiac: RRR,   Respiratory: Clear to auscultation bilaterally. GI: Soft, nontender, non-distended  MS: No edema; No deformity. Neuro:  Nonfocal  Psych: Normal affect   Labs    Chemistry Recent Labs  Lab 12/31/18 1133 01/01/19 0019  NA 135 132*  K 3.1* 7.4*  CL 100 100  CO2 23 26  GLUCOSE 132* 124*  BUN 8 12  CREATININE 0.98 1.18  CALCIUM 9.1 8.5*  GFRNONAA >60 58*  GFRAA >60 >60  ANIONGAP 12 6     Hematology Recent Labs  Lab 12/31/18 1133 01/01/19 0019  WBC 7.9 11.1*  RBC 5.29 4.85  HGB 13.6 13.2  HCT 43.3 39.5  MCV 81.9 81.4  MCH 25.7* 27.2  MCHC 31.4 33.4  RDW 14.5 14.8  PLT 325 348    Cardiac Enzymes Recent Labs  Lab 12/31/18 1133 12/31/18 1737 01/01/19 0019 01/01/19 0636  TROPONINI <0.03 <0.03 <0.03 <0.03   No results for input(s): TROPIPOC in the last 168 hours.   BNPNo results for input(s): BNP, PROBNP in the last 168 hours.   DDimer No results for input(s): DDIMER in the last 168 hours.   Radiology  Dg Chest 2 View  Result Date: 12/31/2018 CLINICAL DATA:  non radiating left sided chest pain/sob/ and productive cough that has been going on x 1 week ; chest pain EXAM: CHEST - 2 VIEW COMPARISON:  None. FINDINGS: Normal mediastinum and cardiac silhouette. Normal pulmonary vasculature. No evidence of effusion, infiltrate, or pneumothorax. No acute bony abnormality. Degenerative osteophytosis of the spine. IMPRESSION: No acute cardiopulmonary process. Electronically Signed   By: Genevive Bi M.D.   On: 12/31/2018 13:08    Cardiac Studies      Patient Profile     82 y.o. male with coronary artery disease admitted with progressive unstable angina symptoms.  Assessment & Plan    .  Coronary artery disease: The patient presents with symptoms of worsening angina over the past several weeks.  Had progressive shortness of breath and chest tightness.  His symptoms feel exactly like his previous episodes of chest discomfort.  Schedule him  for heart catheterization.  2.  Hyperlipidemia:   He is tried multiple statins in the past and had lots of muscle aches.  He would consider a PCSK9 inhibitor.      For questions or updates, please contact [facility] Please consult [email or web address] for contact info under        Signed, Kristeen Miss, MD  01/01/2019, 8:18 AM

## 2019-01-01 NOTE — H&P (View-Only) (Signed)
 Progress Note  Patient Name: Allen Terry Date of Encounter: 01/01/2019  Primary Cardiologist: [place]  Subjective   83-year-old gentleman with a history of coronary artery disease.  He has a stent in his LAD and also a stent in his left circumflex artery.  He presents with episodes of chest pain and shortness of breath with exertion.  His symptoms are exactly like his previous episodes of angina.  His troponin levels are negative. Potassium level was reported as 7.4 this am .   I suspect this is a lab error.   I have written for a repeat BMP  Mild chest tightness this am   Inpatient Medications    Scheduled Meds: . aspirin EC  81 mg Oral Daily  . atorvastatin  40 mg Oral q1800  . carvedilol  3.125 mg Oral BID WC  . sodium chloride flush  3 mL Intravenous Q12H  . tamsulosin  0.4 mg Oral Daily   Continuous Infusions: . sodium chloride 90 mL/hr at 01/01/19 0600  . sodium chloride    . sodium chloride 1 mL/kg/hr (01/01/19 0500)  . heparin 1,500 Units/hr (01/01/19 0809)   PRN Meds: sodium chloride, acetaminophen, nitroGLYCERIN, ondansetron (ZOFRAN) IV, sodium chloride flush, traZODone   Vital Signs    Vitals:   12/31/18 2000 12/31/18 2328 01/01/19 0320 01/01/19 0800  BP: (!) 156/87 127/63 126/67 (!) 145/69  Pulse: 87 64 60 (!) 59  Resp:  13 20 15  Temp: (!) 97.5 F (36.4 C) 98.2 F (36.8 C) 98.1 F (36.7 C) 97.7 F (36.5 C)  TempSrc: Oral Oral Oral Oral  SpO2: 98% 98% 98%   Weight: 93.2 kg     Height: 5' 8" (1.727 m)       Intake/Output Summary (Last 24 hours) at 01/01/2019 0818 Last data filed at 01/01/2019 0800 Gross per 24 hour  Intake 739.76 ml  Output 750 ml  Net -10.24 ml   Last 3 Weights 12/31/2018 12/31/2018 06/26/2016  Weight (lbs) 205 lb 7.5 oz 200 lb 200 lb  Weight (kg) 93.2 kg 90.719 kg 90.719 kg      Telemetry    NSR  - Personally Reviewed  ECG     NSR  - Personally Reviewed  Physical Exam   GEN:  elderly male.   NAD  Neck: No  JVD Cardiac: RRR,   Respiratory: Clear to auscultation bilaterally. GI: Soft, nontender, non-distended  MS: No edema; No deformity. Neuro:  Nonfocal  Psych: Normal affect   Labs    Chemistry Recent Labs  Lab 12/31/18 1133 01/01/19 0019  NA 135 132*  K 3.1* 7.4*  CL 100 100  CO2 23 26  GLUCOSE 132* 124*  BUN 8 12  CREATININE 0.98 1.18  CALCIUM 9.1 8.5*  GFRNONAA >60 58*  GFRAA >60 >60  ANIONGAP 12 6     Hematology Recent Labs  Lab 12/31/18 1133 01/01/19 0019  WBC 7.9 11.1*  RBC 5.29 4.85  HGB 13.6 13.2  HCT 43.3 39.5  MCV 81.9 81.4  MCH 25.7* 27.2  MCHC 31.4 33.4  RDW 14.5 14.8  PLT 325 348    Cardiac Enzymes Recent Labs  Lab 12/31/18 1133 12/31/18 1737 01/01/19 0019 01/01/19 0636  TROPONINI <0.03 <0.03 <0.03 <0.03   No results for input(s): TROPIPOC in the last 168 hours.   BNPNo results for input(s): BNP, PROBNP in the last 168 hours.   DDimer No results for input(s): DDIMER in the last 168 hours.   Radiology      Dg Chest 2 View  Result Date: 12/31/2018 CLINICAL DATA:  non radiating left sided chest pain/sob/ and productive cough that has been going on x 1 week ; chest pain EXAM: CHEST - 2 VIEW COMPARISON:  None. FINDINGS: Normal mediastinum and cardiac silhouette. Normal pulmonary vasculature. No evidence of effusion, infiltrate, or pneumothorax. No acute bony abnormality. Degenerative osteophytosis of the spine. IMPRESSION: No acute cardiopulmonary process. Electronically Signed   By: Genevive Bi M.D.   On: 12/31/2018 13:08    Cardiac Studies      Patient Profile     82 y.o. male with coronary artery disease admitted with progressive unstable angina symptoms.  Assessment & Plan    .  Coronary artery disease: The patient presents with symptoms of worsening angina over the past several weeks.  Had progressive shortness of breath and chest tightness.  His symptoms feel exactly like his previous episodes of chest discomfort.  Schedule him  for heart catheterization.  2.  Hyperlipidemia:   He is tried multiple statins in the past and had lots of muscle aches.  He would consider a PCSK9 inhibitor.      For questions or updates, please contact [facility] Please consult [email or web address] for contact info under        Signed, Kristeen Miss, MD  01/01/2019, 8:18 AM

## 2019-01-01 NOTE — Progress Notes (Signed)
Per RN on 2C, patient reports he is unable to take any other anticoagulant other than Effient which he has been taking daily. Upon reviewing pharmacy records, patient reported to pharmacy he is no longer taking his Effient. Per Dr. Eldridge Dace, verbal order placed for 60mg  loading dose of Effient to be administered and patient will take Effient as requested for his anticoagulation. Emphasized to [facility] RN the importance of patient taking his Effient as prescribed.

## 2019-01-01 NOTE — Interval H&P Note (Signed)
Cath Lab Visit (complete for each Cath Lab visit)  Clinical Evaluation Leading to the Procedure:   ACS: Yes.    Non-ACS:    Anginal Classification: CCS IV  Anti-ischemic medical therapy: Minimal Therapy (1 class of medications)  Non-Invasive Test Results: No non-invasive testing performed  Prior CABG: No previous CABG      History and Physical Interval Note:  01/01/2019 11:05 AM  Allen Terry  has presented today for surgery, with the diagnosis of UA  The various methods of treatment have been discussed with the patient and family. After consideration of risks, benefits and other options for treatment, the patient has consented to  Procedure(s): LEFT HEART CATH AND CORONARY ANGIOGRAPHY (N/A) as a surgical intervention .  The patient's history has been reviewed, patient examined, no change in status, stable for surgery.  I have reviewed the patient's chart and labs.  Questions were answered to the patient's satisfaction.     Lance Muss

## 2019-01-01 NOTE — Progress Notes (Signed)
ANTICOAGULATION CONSULT NOTE - Follow Up Consult  Pharmacy Consult for heparin Indication: USAP   Labs: Recent Labs    12/31/18 1133 12/31/18 1737 01/01/19 0019  HGB 13.6  --  13.2  HCT 43.3  --  39.5  PLT 325  --  348  LABPROT  --   --  14.8  INR  --   --  1.2  HEPARINUNFRC  --   --  <0.10*  CREATININE 0.98  --  1.18  TROPONINI <0.03 <0.03 <0.03    Assessment: 82yo male subtherapeutic on heparin with initial dosing for USAP; no gtt issues or signs of bleeding per RN.  Goal of Therapy:  Heparin level 0.3-0.7 units/ml   Plan:  Will rebolus with heparin 3000 units and increase heparin gtt by 4 units/kg/hr to 1500 units/hr and check level in 8 hours.    Vernard Gambles, PharmD, BCPS  01/01/2019,2:28 AM

## 2019-01-02 ENCOUNTER — Encounter (HOSPITAL_COMMUNITY): Payer: Self-pay | Admitting: Physician Assistant

## 2019-01-02 DIAGNOSIS — E876 Hypokalemia: Secondary | ICD-10-CM

## 2019-01-02 DIAGNOSIS — R7303 Prediabetes: Secondary | ICD-10-CM

## 2019-01-02 DIAGNOSIS — I200 Unstable angina: Secondary | ICD-10-CM

## 2019-01-02 LAB — BASIC METABOLIC PANEL
Anion gap: 6 (ref 5–15)
BUN: 9 mg/dL (ref 8–23)
CO2: 22 mmol/L (ref 22–32)
Calcium: 8.4 mg/dL — ABNORMAL LOW (ref 8.9–10.3)
Chloride: 111 mmol/L (ref 98–111)
Creatinine, Ser: 1.01 mg/dL (ref 0.61–1.24)
GFR calc Af Amer: >60 mL/min (ref >60)
GFR calc non Af Amer: >60 mL/min (ref >60)
GLUCOSE: 126 mg/dL — AB (ref 70–99)
Potassium: 3.7 mmol/L (ref 3.5–5.1)
Sodium: 139 mmol/L (ref 135–145)

## 2019-01-02 LAB — CBC
HCT: 41.5 % (ref 39.0–52.0)
Hemoglobin: 13 g/dL (ref 13.0–17.0)
MCH: 26.4 pg (ref 26.0–34.0)
MCHC: 31.3 g/dL (ref 30.0–36.0)
MCV: 84.3 fL (ref 80.0–100.0)
Platelets: 317 10*3/uL (ref 150–400)
RBC: 4.92 MIL/uL (ref 4.22–5.81)
RDW: 15.1 % (ref 11.5–15.5)
WBC: 8.5 10*3/uL (ref 4.0–10.5)
nRBC: 0 % (ref 0.0–0.2)

## 2019-01-02 MED ORDER — EZETIMIBE 10 MG PO TABS: 10.0000 mg | ORAL_TABLET | Freq: Every day | ORAL | 5 refills | Status: DC

## 2019-01-02 MED ORDER — ATORVASTATIN CALCIUM 40 MG PO TABS: 40.0000 mg | ORAL_TABLET | Freq: Every evening | ORAL | 5 refills | Status: DC

## 2019-01-02 MED ORDER — PRASUGREL HCL 10 MG PO TABS: 10.0000 mg | ORAL_TABLET | Freq: Every day | ORAL | 11 refills | Status: DC

## 2019-01-02 MED ORDER — CARVEDILOL 3.125 MG PO TABS: 3.1250 mg | ORAL_TABLET | Freq: Two times a day (BID) | ORAL | 5 refills | Status: DC

## 2019-01-02 NOTE — Plan of Care (Signed)
  Problem: Coping: Goal: Level of anxiety will decrease Outcome: Progressing   Problem: Elimination: Goal: Will not experience complications related to bowel motility Outcome: Progressing Goal: Will not experience complications related to urinary retention Outcome: Progressing   

## 2019-01-02 NOTE — Progress Notes (Signed)
Progress Note  Patient Name: Allen Terry Date of Encounter: 01/02/2019   Primary Cardiologist: New (lives in [place])  Subjective   No chest pain or shortness of breath.  Has been walking around on the unit, eager to go home.  He refused both Coreg and Zetia today stating that he is sensitive to medications and is worried that he may have side effects.  Inpatient Medications    Scheduled Meds: . aspirin EC  81 mg Oral Daily  . atorvastatin  40 mg Oral q1800  . carvedilol  3.125 mg Oral BID WC  . ezetimibe  10 mg Oral Daily  . prasugrel  10 mg Oral Daily  . sodium chloride flush  3 mL Intravenous Q12H  . tamsulosin  0.4 mg Oral Daily   Continuous Infusions: . sodium chloride 90 mL/hr at 01/01/19 0600  . sodium chloride Stopped (01/01/19 1700)   PRN Meds: sodium chloride, acetaminophen, nitroGLYCERIN, ondansetron (ZOFRAN) IV, sodium chloride flush, traZODone   Vital Signs    Vitals:   01/01/19 2357 01/02/19 0227 01/02/19 0746 01/02/19 0748  BP: (!) 148/67 (!) 162/69 (!) 159/69 (!) 158/70  Pulse: 61 75  78  Resp: 15 13 15  (!) 23  Temp: 98.1 F (36.7 C) 98.4 F (36.9 C)  (!) 97.5 F (36.4 C)  TempSrc: Oral Oral  Oral  SpO2: 98% 98%  99%  Weight:      Height:        Intake/Output Summary (Last 24 hours) at 01/02/2019 1127 Last data filed at 01/02/2019 0900 Gross per 24 hour  Intake 1025.59 ml  Output 1675 ml  Net -649.41 ml   Filed Weights   12/31/18 1126 12/31/18 2000  Weight: 90.7 kg 93.2 kg    Telemetry    Sinus rhythm with occasional ectopy.  Personally reviewed.  ECG    Tracing from 01/02/2019 shows sinus rhythm with IVCD, PAC, nonspecific ST changes.  Personally reviewed.  Physical Exam   GEN:  Elderly male.  No acute distress.   Neck: No JVD. Cardiac: RRR, no murmur, rub, or gallop.  Respiratory: Nonlabored. Clear to auscultation bilaterally. GI: Soft, nontender, bowel sounds present. MS: No edema; No deformity. Neuro:  Nonfocal. Psych:  Alert and oriented x 3. Calm.  Labs    Chemistry Recent Labs  Lab 01/01/19 0019 01/01/19 0855 01/02/19 0243  NA 132* 135 139  K 7.4* 3.2* 3.7  CL 100 104 111  CO2 26 22 22   GLUCOSE 124* 123* 126*  BUN 12 9 9   CREATININE 1.18 1.00 1.01  CALCIUM 8.5* 8.6* 8.4*  GFRNONAA 58* >60 >60  GFRAA >60 >60 >60  ANIONGAP 6 9 6      Hematology Recent Labs  Lab 12/31/18 1133 01/01/19 0019 01/02/19 0243  WBC 7.9 11.1* 8.5  RBC 5.29 4.85 4.92  HGB 13.6 13.2 13.0  HCT 43.3 39.5 41.5  MCV 81.9 81.4 84.3  MCH 25.7* 27.2 26.4  MCHC 31.4 33.4 31.3  RDW 14.5 14.8 15.1  PLT 325 348 317    Cardiac Enzymes Recent Labs  Lab 12/31/18 1133 12/31/18 1737 01/01/19 0019 01/01/19 0636  TROPONINI <0.03 <0.03 <0.03 <0.03   No results for input(s): TROPIPOC in the last 168 hours.    Radiology    Dg Chest 2 View  Result Date: 12/31/2018 CLINICAL DATA:  non radiating left sided chest pain/sob/ and productive cough that has been going on x 1 week ; chest pain EXAM: CHEST - 2 VIEW COMPARISON:  None.  FINDINGS: Normal mediastinum and cardiac silhouette. Normal pulmonary vasculature. No evidence of effusion, infiltrate, or pneumothorax. No acute bony abnormality. Degenerative osteophytosis of the spine. IMPRESSION: No acute cardiopulmonary process. Electronically Signed   By: Genevive Bi M.D.   On: 12/31/2018 13:08    Cardiac Studies   Cardiac catheterization 05/03/2019:  Non-stenotic Prox LAD lesion was previously treated.  Previously placed Ost 2nd Mrg drug eluting stent is widely patent.  Patent RCA stent.  Prox LAD to Mid LAD lesion is 80% stenosed.  A drug-eluting stent was successfully placed using a STENT SYNERGY DES 2.5X20.  Post intervention, there is a 0% residual stenosis.  The left ventricular systolic function is normal.  LV end diastolic pressure is normal. LVEDP 13 mm Hg.  The left ventricular ejection fraction is 55-65% by visual estimate.  There is no aortic  valve stenosis.  Ost 1st Diag lesion is 70% stenosed.   Progression of LAD and diagonal disease compared to prior.    LAD treated.  Medical therapy for diagonal.  Consider clopidogrel monotherapy after 1 year.  Effient not used due to his age.  He was unclear why this was used in the past.  Possible discharge later today.   Patient Profile     83 y.o. male with a history of CAD status post DES to the RCA in 2012, DES to the LAD in 2015, and DES to the second obtuse marginal in 2017.  He presents with unstable angina and underwent follow-up cardiac catheterization on March 6 with a new proximal to mid 80% LAD lesion that was treated with DES, otherwise previously placed stents were patent.  Assessment & Plan    1.  Status post DES intervention to the proximal to mid LAD on March 7 in the setting of unstable angina.  Troponin I negative.  LVEF 55 to 65% by visual estimate at cardiac catheterization.  2.  Essential hypertension by history.  3.  Mixed hyperlipidemia with reported statin intolerance.  I reviewed the patient's history and hospital progress per chart review.  He is very eager to go home today.  Although Dr. Royann Shivers is listed as his primary cardiologist, he lives in [place] and does not recall seeing him.  We will arrange a post hospital visit in the next 7 to 10 days with one of our [facility] providers.  I reviewed his medications which were modified by Dr. Eldridge Dace - he will continue on aspirin and Effient (not Plavix).  Would also recommend continuing Lipitor, Coreg, and Zetia, although it is not clear to me that the patient will take these at home based on discussion today.  Prescriptions will be provided.  Signed, Nona Dell, MD  01/02/2019, 11:27 AM

## 2019-01-02 NOTE — Discharge Summary (Signed)
Discharge Summary    Patient ID: Allen Terry,  MRN: [id number], DOB/AGE: [birth year] 83 y.o.  Admit date: 12/31/2018 Discharge date: 01/02/2019  Primary Care Provider: System, Pcp Not In Primary Cardiologist: No primary care provider on file. - previously seen by Dr. Royann Shivers in 2017 while inpatient, was supposed to f/u in [place] but never did. Primary Electrophysiologist:  None  Discharge Diagnoses    Principal Problem:   Unstable angina [facility]) Active Problems:   CAD in native artery   Hyperlipidemia   HTN (hypertension)   Pre-diabetes   Hypokalemia   Diagnostic Studies/Procedures    Cardiac Cath 01/01/19 Conclusion     Non-stenotic Prox LAD lesion was previously treated.  Previously placed Ost 2nd Mrg drug eluting stent is widely patent.  Patent RCA stent.  Prox LAD to Mid LAD lesion is 80% stenosed.  A drug-eluting stent was successfully placed using a STENT SYNERGY DES 2.5X20.  Post intervention, there is a 0% residual stenosis.  The left ventricular systolic function is normal.  LV end diastolic pressure is normal. LVEDP 13 mm Hg.  The left ventricular ejection fraction is 55-65% by visual estimate.  There is no aortic valve stenosis.  Ost 1st Diag lesion is 70% stenosed.   Progression of LAD and diagonal disease compared to prior.    LAD treated.  Medical therapy for diagonal.  Consider clopidogrel monotherapy after 1 year.  Effient not used due to his age.  He was unclear why this was used in the past.  Possible discharge later today.     _____________     History of Present Illness     Allen Terry is a 83 y.o. male with history of CAD status post DES to the RCA in 2012, DES to the LAD in 2015, and DES to the second obtuse marginal in 2017, anemia, HTN, HLD, noncompliance who presented to [facility] for chest pain. He was last evaluated by our team in 2017 with PCI as above. Followup appointment was arranged but the last  contact our office had had with him was via telephone call where he was reminded of his appointment and had stated, "I can't promise I will be there. I'm in the middle of fixing my sandwich and don't want to think about it now." He did not reschedule that appointment.  Hospital Course    He presented with exertional DOE and chest tightness/pressure for the last 1-2 weeks similar to prior angina. He was admitted for further evaluation.  Troponins remained negative. His chest pain was felt concerning for [place] so he underwent cardiac cath with findings above, with 80% prox-mid LAD treated with DES. Medical therapy was recommended for residual diagonal disease, 70%. EF was 55-65%, LVEDP normal. The initial recommendation was for uninterrupted DAPT with Aspirin  daily and Clopidogrel  daily for a minimum of 12 months, then consider Clopidogrel monotherapy. He had been on Effient prior to admission but this was not the first choice given his age. However, after starting Plavix, the patient reported that Plavix does not work for him and another medication "nearly killed" him (likely [place]).  He insisted on Effient and therefore Dr. Eldridge Dace loaded him with Effient and recommended  daily. He was reminded of bleeding precautions. He was hypokalemic during admission requiring replacement (one spurious value of 7.4 which was a lab error). This was likely due to HCTZ which was switched to carvedilol due to his underlying CAD. He was also restarted on atorvastatin and offered  Zetia but declined these and beta blocker during hospital stay. Dr. Diona Browner feels that we should prescribe guideline directed therapy although it is unclear if the patient will be compliant. The importance was stressed to him. He was seen by cardiac rehab and ambulated well without issue. He was referred to [facility] cardiac rehab. He stated he is unlikely to attend as he likes to walk on his own time. Dr. Diona Browner has seen and examined the  patient today and feels he is stable for discharge. Prescriptions were printed as his usual pharmacy closes at 1 so that he may take them to his open alternative. We are also asking care management to assess for any Effient needs prior to discharge. I sent message to [facility] scheduling pool requesting f/u appt.   If the patient is tolerating statin at time of follow-up appointment, would consider rechecking liver function/lipid panel in 6-8 weeks. He also needs to f/u with primary care for pre-DM (A1C 6.2) and abnormal free T4 (although TSH was normal). _____________  Discharge Vitals Blood pressure (!) 158/70, pulse 78, temperature (!) 97.5 F (36.4 C), temperature source Oral, resp. rate (!) 23, height 5\' 8"  (1.727 m), weight 93.2 kg, SpO2 99 %.  Filed Weights   12/31/18 1126 12/31/18 2000  Weight: 90.7 kg 93.2 kg    Labs & Radiologic Studies    CBC Recent Labs    12/31/18 1133 01/01/19 0019 01/02/19 0243  WBC 7.9 11.1* 8.5  NEUTROABS 4.9  --   --   HGB 13.6 13.2 13.0  HCT 43.3 39.5 41.5  MCV 81.9 81.4 84.3  PLT 325 348 317   Basic Metabolic Panel Recent Labs    15/05/69 1737  01/01/19 0855 01/02/19 0243  NA  --    < > 135 139  K  --    < > 3.2* 3.7  CL  --    < > 104 111  CO2  --    < > 22 22  GLUCOSE  --    < > 123* 126*  BUN  --    < > 9 9  CREATININE  --    < > 1.00 1.01  CALCIUM  --    < > 8.6* 8.4*  MG 2.3  --   --   --    < > = values in this interval not displayed.   Cardiac Enzymes Recent Labs    12/31/18 1737 01/01/19 0019 01/01/19 0636  TROPONINI <0.03 <0.03 <0.03   Hemoglobin A1C Recent Labs    12/31/18 1712  HGBA1C 6.2*   Fasting Lipid Panel Recent Labs    01/01/19 0019  CHOL 138  HDL 16*  LDLCALC 63  TRIG 794*  CHOLHDL 8.6   Thyroid Function Tests Recent Labs    12/31/18 1712  TSH 2.371   _____________  Dg Chest 2 View  Result Date: 12/31/2018 CLINICAL DATA:  non radiating left sided chest pain/sob/ and productive cough  that has been going on x 1 week ; chest pain EXAM: CHEST - 2 VIEW COMPARISON:  None. FINDINGS: Normal mediastinum and cardiac silhouette. Normal pulmonary vasculature. No evidence of effusion, infiltrate, or pneumothorax. No acute bony abnormality. Degenerative osteophytosis of the spine. IMPRESSION: No acute cardiopulmonary process. Electronically Signed   By: Genevive Bi M.D.   On: 12/31/2018 13:08   Disposition   Pt is being discharged home today in good condition.  Follow-up Plans & Appointments    Follow-up Information    [facility]  Christmas Follow up.   Specialty:  Cardiology Why:  [facility] [place] office - Our office will call you for a follow-up appointment. Please call the office if you have not heard from [place] within 3 days.  Contact information: [address], [address] [place] [address] [phone]       Primary Care Provider Follow up.   Why:  Your hospital bloodwork showed you are pre-diabetic. Your thyroid function also showed a mild abnormality in the circulating hormone although the TSH was completely normal. Please schedule a 2 week post-hospital checkup to discuss further monitoring.         Discharge Instructions    Amb Referral to Cardiac Rehabilitation   Complete by:  As directed    Diagnosis:  Coronary Stents   Diet - low sodium heart healthy   Complete by:  As directed    Discharge instructions   Complete by:  As directed    Ibuprofen was discontinued from your medicine list. Patients taking blood thinners should generally limit medicines like ibuprofen, Advil, Motrin, naproxen, and Aleve due to risk of stomach bleeding. You may take Tylenol as directed or talk to your primary doctor about alternatives.  Oxycodone was removed from your list as you indicated you were not taking it.  Your heart doctor recommended to stop hydrochlorothiazide and change to carvedilol as this is a medicine that helps both your  heart and your blood pressure. He also recommends to start medication for your cholesterol (atorvastatin and ezetimibe). If you choose not to take these, you must seriously consider going on an alternative such as an injectable medicine. Please talk to your cardiology provider at your follow-up appointment about this.  You were given a new prescription for Effient (prasugrel) as well. If you notice any bleeding such as blood in stool, black tarry stools, blood in urine, nosebleeds or any other unusual bleeding, call your doctor immediately. It is not normal to have this kind of bleeding while on a blood thinner and usually indicates there is an underlying problem with one of your body systems that needs to be checked out.   Increase activity slowly   Complete by:  As directed    No driving for 2 days. No lifting over 5 lbs for 1 week. No sexual activity for 1 week. Keep procedure site clean & dry. If you notice increased pain, swelling, bleeding or pus, call/return!  You may shower, but no soaking baths/hot tubs/pools for 1 week.      Discharge Medications   Allergies as of 01/02/2019      Reactions   Ferrous Sulfate Other (See Comments)   Face burning   Meclizine Other (See Comments)   Burning in face when taking iron   Metoprolol Other (See Comments)   abd pain   Pravastatin Other (See Comments)   Myalgias, even when combined with coenzyme q 10   Ticagrelor Other (See Comments)   Cough, sneezing and nasal congestion   Lisinopril Cough   Penicillins Hives, Rash   Did it involve swelling of the face/tongue/throat, SOB, or low BP? No Did it involve sudden or severe rash/hives, skin peeling, or any reaction on the inside of your mouth or nose? No Did you need to seek medical attention at a hospital or doctor's office? Yes When did it last happen?40 years If all above answers are "NO", may proceed with cephalosporin use.      Medication List    STOP taking these  medications    hydrochlorothiazide 25 MG tablet Commonly known as:  HYDRODIURIL   ibuprofen 600 MG tablet Commonly known as:  ADVIL,MOTRIN   oxyCODONE-acetaminophen 5-325 MG tablet Commonly known as:  Roxicet     TAKE these medications   aspirin EC 81 MG tablet Take 81 mg by mouth daily.   atorvastatin 40 MG tablet Commonly known as:  LIPITOR Take 1 tablet (40 mg total) by mouth every evening.   carvedilol 3.125 MG tablet Commonly known as:  COREG Take 1 tablet (3.125 mg total) by mouth 2 (two) times daily with a meal.   ezetimibe 10 MG tablet Commonly known as:  ZETIA Take 1 tablet (10 mg total) by mouth daily. Start taking on:  January 03, 2019   nitroGLYCERIN 0.4 MG SL tablet Commonly known as:  NITROSTAT Place 1 tablet (0.4 mg total) under the tongue every 5 (five) minutes x 3 doses as needed for chest pain.   prasugrel 10 MG Tabs tablet Commonly known as:  EFFIENT Take 1 tablet (10 mg total) by mouth daily.   tamsulosin 0.4 MG Caps capsule Commonly known as:  FLOMAX Take 0.4 mg by mouth daily.        Allergies:  Allergies  Allergen Reactions  . Ferrous Sulfate Other (See Comments)    Face burning  . Meclizine Other (See Comments)    Burning in face when taking iron  . Metoprolol Other (See Comments)    abd pain  . Pravastatin Other (See Comments)    Myalgias, even when combined with coenzyme q 10  . Ticagrelor Other (See Comments)    Cough, sneezing and nasal congestion  . Lisinopril Cough  . Penicillins Hives and Rash    Did it involve swelling of the face/tongue/throat, SOB, or low BP? No Did it involve sudden or severe rash/hives, skin peeling, or any reaction on the inside of your mouth or nose? No Did you need to seek medical attention at a hospital or doctor's office? Yes When did it last happen?40 years If all above answers are "NO", may proceed with cephalosporin use.    Outstanding Labs/Studies   If the patient is tolerating statin at time of  follow-up appointment, would consider rechecking liver function/lipid panel in 6-8 weeks.   Duration of Discharge Encounter   Greater than 30 minutes including physician time.  Signed, Tacey Ruizayna N Dunn PA-C 01/02/2019, 12:30 PM

## 2019-01-02 NOTE — Discharge Instructions (Signed)

## 2019-01-02 NOTE — Progress Notes (Signed)
Late note: good rx coupons were printed out to show pt can go to alternative pharmacy of [facility], [facility] or alternative pharmacy of his choice since his pharmacy is closed today and tomorrow. The importance of DAPT was stressed to patient this admission. Dayna Dunn PA-C

## 2019-01-02 NOTE — Care Management (Signed)
No effient coupons available from drug company.  Ranges $15-$35 without insurance.  Pt's pharmacy closed.  Scripts printed.

## 2019-01-02 NOTE — Progress Notes (Signed)
Good Rx coupon given to patient. Pt stated that he was going to go get it filled for tomorrow. Discharge instructions discussed and explained to patient. Prescriptions given. Pt going home via w/c with belongings and sister.

## 2019-01-02 NOTE — Progress Notes (Addendum)
CARDIAC REHAB PHASE I   PRE:  Rate/Rhythm: Sinus Rhythm with Arrhythmia 71  BP:    Sitting: 156/73     SaO2: 96%  MODE:  Ambulation: 450 ft   POST:  Rate/Rhythem: 77  BP:    Sitting: 165/80 then 134/70     SaO2: 98% 1000-1055 Patient ambulated in the hallway independently without complaints. Reviewed heart healthy diet with patient. Not sure how compliant the patient will be. Patient referred to [facility]. Allen Terry says he is unlikely to attend as he likes to walk on his own time. Patient given stent card. Reviewed exercise instructions with the patient. Use of sublingual nitroglycerin and when to call 911.   Thayer Headings RN BSN

## 2019-01-05 ENCOUNTER — Telehealth: Payer: Self-pay

## 2019-01-05 NOTE — Telephone Encounter (Signed)
-----   Message from Laurann [place], [place] sent at 01/02/2019 12:02 PM EST ----- Regarding: Needs f/u appt Please schedule this patient for a follow-up appointment and call them with that information.  Primary Cardiologist: previously Dr. Royann Shivers in 2017 but pt lives in [place]. He was previously supposed to f/u with [place] in [place] but the phone note indicates he was busy making a sandwich and couldn't promise he could be there. He never came back to see [place], until he was admitted this admission and required a stent. He wants to f/u in [place]. Date of Discharge: 01/02/2019 Appointment Needed Within: 1-2 weeks - OK to see APP Appointment Type: post hospital  Thank you! Dayna Dunn PA-C

## 2019-01-05 NOTE — Telephone Encounter (Signed)
lmov to schedule  °

## 2019-01-07 NOTE — Telephone Encounter (Signed)
lmov to schedule  °

## 2019-01-22 NOTE — Telephone Encounter (Signed)
Please advise if this is Evisit or in office

## 2019-01-25 NOTE — Telephone Encounter (Signed)
lmov to schedule appt  °

## 2019-03-24 NOTE — Telephone Encounter (Signed)
No ans no vm   °

## 2019-04-03 ENCOUNTER — Emergency Department: Payer: Medicare Other

## 2019-04-03 ENCOUNTER — Emergency Department
Admission: EM | Admit: 2019-04-03 | Discharge: 2019-04-03 | Disposition: A | Discharge status: Home / Self Care | Payer: Medicare Other | Attending: Emergency Medicine | Admitting: Emergency Medicine

## 2019-04-03 ENCOUNTER — Other Ambulatory Visit: Payer: Self-pay

## 2019-04-03 DIAGNOSIS — Y939 Activity, unspecified: Secondary | ICD-10-CM | POA: Diagnosis not present

## 2019-04-03 DIAGNOSIS — W19XXXA Unspecified fall, initial encounter: Secondary | ICD-10-CM | POA: Insufficient documentation

## 2019-04-03 DIAGNOSIS — Y999 Unspecified external cause status: Secondary | ICD-10-CM | POA: Insufficient documentation

## 2019-04-03 DIAGNOSIS — Z87891 Personal history of nicotine dependence: Secondary | ICD-10-CM | POA: Insufficient documentation

## 2019-04-03 DIAGNOSIS — I10 Essential (primary) hypertension: Secondary | ICD-10-CM | POA: Insufficient documentation

## 2019-04-03 DIAGNOSIS — S59912A Unspecified injury of left forearm, initial encounter: Secondary | ICD-10-CM | POA: Diagnosis present

## 2019-04-03 DIAGNOSIS — S52502A Unspecified fracture of the lower end of left radius, initial encounter for closed fracture: Secondary | ICD-10-CM | POA: Insufficient documentation

## 2019-04-03 DIAGNOSIS — Z79899 Other long term (current) drug therapy: Secondary | ICD-10-CM | POA: Insufficient documentation

## 2019-04-03 DIAGNOSIS — Y929 Unspecified place or not applicable: Secondary | ICD-10-CM | POA: Insufficient documentation

## 2019-04-03 DIAGNOSIS — Z7982 Long term (current) use of aspirin: Secondary | ICD-10-CM | POA: Insufficient documentation

## 2019-04-03 DIAGNOSIS — I25110 Atherosclerotic heart disease of native coronary artery with unstable angina pectoris: Secondary | ICD-10-CM | POA: Diagnosis not present

## 2019-04-03 MED ORDER — TRAMADOL HCL 50 MG PO TABS
50.0000 mg | ORAL_TABLET | Freq: Once | ORAL | Status: AC
  Administered 2019-04-03: 50 mg via ORAL
  Filled 2019-04-03: qty 1

## 2019-04-03 MED ORDER — TRAMADOL HCL 50 MG PO TABS: 50.0000 mg | ORAL_TABLET | Freq: Four times a day (QID) | ORAL | 0 refills | Status: DC | PRN

## 2019-04-03 NOTE — Discharge Instructions (Signed)
Call Dr. Harden Mo office on Monday to request an appointment. Leave the splint in place until your appointment.  Return to the ER for symptoms of concern between now and your appointment.

## 2019-04-03 NOTE — ED Triage Notes (Signed)
Pt states he lost his balance and fell today , injured his left wrist,.

## 2019-04-03 NOTE — ED Notes (Signed)
See triage note  States he bent down to tie his shoe became slightly dizzy and fell  Swelling and questionable deformity noted to wrist

## 2019-04-03 NOTE — ED Provider Notes (Signed)
[facility] Emergency Department Provider Note ____________________________________________  Time seen: Approximately 3:52 PM  I have reviewed the triage vital signs and the nursing notes.   HISTORY  Chief Complaint Fall    HPI Allen Terry is a 83 y.o. male who presents to the emergency department for evaluation and treatment of left wrist pain after mechanical, non-syncopal fall earlier today.  Patient states that he bent over to tie his shoes and lost his balance.  He fell on outstretched hand which braced him and he did not strike his head or experiencing loss of consciousness.  He denies any injury to this wrist in the past.  He is right-hand dominant.  No alleviating measures attempted prior to arrival. Past Medical History:  Diagnosis Date  . Acute lower GI bleeding 2015   "related to blood thinners"  . Anemia   . Bursitis of both shoulders   . CAD (coronary artery disease)    a. s/p PCI 01/17/2011 85% proximal RCA with DES. b. PCI 09/05/2014 60% mid LAD lesion, with DES  b. Cath 02/13/2016 80% ost OM2 treated with 2.7514 mm Resolute DES. d. Cath 12/2018 s/p DES to prox-mid LAD with residual 70% diagonal disease for medical therapy, EF 55-65%.  Marland Kitchen HTN (hypertension)    "not anymore" (02/12/2016)  . Hyperlipidemia   . Noncompliance   . Pneumonia    "double"  . Pre-diabetes   . Tuberculosis    "I've got 25% somewhere in my body" (02/12/2016)    Patient Active Problem List   Diagnosis Date Noted  . Pre-diabetes 01/02/2019  . Hypokalemia 01/02/2019  . Hyperlipidemia   . HTN (hypertension)   . CAD in native artery   . DOE (dyspnea on exertion)   . Unstable angina ([place]) 02/12/2016    Past Surgical History:  Procedure Laterality Date  . CARDIAC CATHETERIZATION N/A 02/13/2016   Procedure: Left Heart Cath and Coronary Angiography;  Surgeon: Troy Sine, MD;  Location: [place] [facility];  Service: Cardiovascular;  Laterality: N/A;  . CARDIAC  CATHETERIZATION N/A 02/13/2016   Procedure: Coronary Stent Intervention;  Surgeon: Troy Sine, MD;  Location: [place] [facility];  Service: Cardiovascular;  Laterality: N/A;  . CATARACT EXTRACTION W/ INTRAOCULAR LENS  IMPLANT, BILATERAL Bilateral   . CORONARY ANGIOPLASTY WITH STENT PLACEMENT     s/p PCI 01/17/2011 85% proximal RCA with DES, PCI 09/05/2014 60% mid LAD lesion, with DES   . CORONARY STENT INTERVENTION N/A 01/01/2019   Procedure: CORONARY STENT INTERVENTION;  Surgeon: Jettie Booze, MD;  Location: [place] [facility];  Service: Cardiovascular;  Laterality: N/A;  . LEFT HEART CATH AND CORONARY ANGIOGRAPHY N/A 01/01/2019   Procedure: LEFT HEART CATH AND CORONARY ANGIOGRAPHY;  Surgeon: Jettie Booze, MD;  Location: [place] [facility];  Service: Cardiovascular;  Laterality: N/A;  . TONSILLECTOMY    . UPPER GI ENDOSCOPY     s/p GI bleed related to [place]    Prior to Admission medications   Medication Sig Start Date End Date Taking? Authorizing Provider  aspirin EC 81 MG tablet Take 81 mg by mouth daily.     [provider]  atorvastatin (LIPITOR) 40 MG tablet Take 1 tablet (40 mg total) by mouth every evening. 01/02/19   Dunn, Nedra Hai, PA-C  carvedilol (COREG) 3.125 MG tablet Take 1 tablet (3.125 mg total) by mouth 2 (two) times daily with a meal. 01/02/19   Dunn, Nedra Hai, PA-C  ezetimibe (ZETIA) 10  MG tablet Take 1 tablet (10 mg total) by mouth daily. 01/03/19   Dunn, Tacey Ruizayna N, PA-C  nitroGLYCERIN (NITROSTAT) 0.4 MG SL tablet Place 1 tablet (0.4 mg total) under the tongue every 5 (five) minutes x 3 doses as needed for chest pain. 02/14/16   Azalee CourseMeng, Hao, PA  prasugrel (EFFIENT) 10 MG TABS tablet Take 1 tablet (10 mg total) by mouth daily. 01/02/19   Dunn, Tacey Ruizayna N, PA-C  tamsulosin (FLOMAX) 0.4 MG CAPS capsule Take 0.4 mg by mouth daily.    [provider]  traMADol (ULTRAM) 50 MG tablet Take 1 tablet (50 mg total) by mouth every 6 (six) hours as needed. 04/03/19    Ancil Dewan, Rulon Eisenmengerari B, FNP    Allergies Ferrous sulfate; Meclizine; Metoprolol; Pravastatin; Ticagrelor; Lisinopril; and Penicillins  Family History  Problem Relation Age of Onset  . Arrhythmia Mother   . Arrhythmia Sister   . Stroke Father   . Hypertension Brother     Social History Social History   Tobacco Use  . Smoking status: Former Smoker    Types: Cigarettes  . Smokeless tobacco: Never Used  . Tobacco comment: "quit smoking cigarettes in ~ 2014"  Substance Use Topics  . Alcohol use: Yes    Comment: "quit drinking in ~ 1984; did have a problem w/alcohol at one time"  . Drug use: No    Review of Systems Constitutional: Negative for fever. Cardiovascular: Negative for chest pain. Respiratory: Negative for shortness of breath. Musculoskeletal: Positive for left wrist pain. Skin: Positive for swelling over the dorsal aspect of the left wrist. Neurological: Negative for decrease in sensation  ____________________________________________   PHYSICAL EXAM:  VITAL SIGNS: ED Triage Vitals  Enc Vitals Group     BP 04/03/19 1255 (!) 161/59     Pulse Rate 04/03/19 1255 93     Resp 04/03/19 1254 17     Temp 04/03/19 1254 98.1 F (36.7 C)     Temp Source 04/03/19 1254 Oral     SpO2 04/03/19 1255 96 %     Weight 04/03/19 1255 185 lb (83.9 kg)     Height 04/03/19 1255 5\' 8"  (1.727 m)     Head Circumference --      Peak Flow --      Pain Score 04/03/19 1254 8     Pain Loc --      Pain Edu? --      Excl. in GC? --     Constitutional: Alert and oriented. Well appearing and in no acute distress. Eyes: Conjunctivae are clear without discharge or drainage Head: Atraumatic Neck: Supple.  No focal midline tenderness on evaluation. Respiratory: No cough. Respirations are even and unlabored. Musculoskeletal: Slight deformity noted to the distal aspect of the left radius. Neurologic: Motor and sensory function of the left upper extremity is intact. Skin: Mild swelling over  the distal aspect of the left radius without any open wounds or lesions. Psychiatric: Affect and behavior are appropriate.  ____________________________________________   LABS (all labs ordered are listed, but only abnormal results are displayed)  Labs Reviewed - No data to display ____________________________________________  RADIOLOGY  Nondisplaced distal left radius fracture ____________________________________________   PROCEDURES  .Ortho Injury Treatment Date/Time: 04/03/2019 3:55 PM Performed by: Jacqlyn Larsenhomas, Jacob M, NT Authorized by: Chinita Pesterriplett, Sylena Lotter B, FNP   Consent:    Consent obtained:  Verbal   Consent given by:  Patient   Risks discussed:  FractureInjury location: wrist Location details: left wrist Injury type: fracture Fracture  type: distal radius Pre-procedure neurovascular assessment: neurovascularly intact Manipulation performed: no Immobilization: splint Splint type: sugar tong Supplies used: cotton padding,  elastic bandage and Ortho-Glass Post-procedure neurovascular assessment: post-procedure neurovascularly intact     ____________________________________________   INITIAL IMPRESSION / ASSESSMENT AND PLAN / ED COURSE  Gaven J Rosensteel is a 83 y.o. who presents to the emergency department for treatment and evaluation of left wrist pain after a mechanical, non-syncopal fall.  Patient was placed in a sugar tong OCL and given a sling.  He is to call and schedule follow-up appointment with orthopedics.  He is to return to the emergency department for symptoms of change or worsen if he is unable to schedule an appointment.   Medications  traMADol (ULTRAM) tablet 50 mg (50 mg Oral Given 04/03/19 1503)    Pertinent labs & imaging results that were available during my care of the patient were reviewed by me and considered in my medical decision making (see chart for details).  _________________________________________   FINAL CLINICAL IMPRESSION(S) / ED  DIAGNOSES  Final diagnoses:  Closed fracture of distal end of left radius, unspecified fracture morphology, initial encounter    ED Discharge Orders         Ordered    traMADol (ULTRAM) 50 MG tablet  Every 6 hours PRN,   Status:  Discontinued     04/03/19 1436    traMADol (ULTRAM) 50 MG tablet  Every 6 hours PRN     04/03/19 1501           If controlled substance prescribed during this visit, 12 month history viewed on the [place]CCSRS prior to issuing an initial prescription for Schedule II or III opiod.   Chinita Pesterriplett, Tarell Schollmeyer B, FNP 04/03/19 1556    Jeanmarie PlantMcShane, James A, MD 04/04/19 [phone]

## 2019-04-07 ENCOUNTER — Emergency Department
Admission: EM | Admit: 2019-04-07 | Discharge: 2019-04-07 | Disposition: A | Discharge status: Home / Self Care | Payer: Medicare Other | Attending: Emergency Medicine | Admitting: Emergency Medicine

## 2019-04-07 ENCOUNTER — Other Ambulatory Visit: Payer: Self-pay

## 2019-04-07 ENCOUNTER — Encounter: Payer: Self-pay | Admitting: Emergency Medicine

## 2019-04-07 ENCOUNTER — Emergency Department: Payer: Medicare Other

## 2019-04-07 DIAGNOSIS — Z79899 Other long term (current) drug therapy: Secondary | ICD-10-CM | POA: Insufficient documentation

## 2019-04-07 DIAGNOSIS — R05 Cough: Secondary | ICD-10-CM | POA: Insufficient documentation

## 2019-04-07 DIAGNOSIS — I10 Essential (primary) hypertension: Secondary | ICD-10-CM | POA: Diagnosis not present

## 2019-04-07 DIAGNOSIS — I2510 Atherosclerotic heart disease of native coronary artery without angina pectoris: Secondary | ICD-10-CM | POA: Insufficient documentation

## 2019-04-07 DIAGNOSIS — K921 Melena: Secondary | ICD-10-CM | POA: Diagnosis not present

## 2019-04-07 DIAGNOSIS — Z87891 Personal history of nicotine dependence: Secondary | ICD-10-CM | POA: Diagnosis not present

## 2019-04-07 DIAGNOSIS — J3489 Other specified disorders of nose and nasal sinuses: Secondary | ICD-10-CM | POA: Diagnosis not present

## 2019-04-07 DIAGNOSIS — Z20828 Contact with and (suspected) exposure to other viral communicable diseases: Secondary | ICD-10-CM | POA: Insufficient documentation

## 2019-04-07 DIAGNOSIS — Z955 Presence of coronary angioplasty implant and graft: Secondary | ICD-10-CM | POA: Insufficient documentation

## 2019-04-07 DIAGNOSIS — R0981 Nasal congestion: Secondary | ICD-10-CM | POA: Diagnosis not present

## 2019-04-07 DIAGNOSIS — Z7982 Long term (current) use of aspirin: Secondary | ICD-10-CM | POA: Insufficient documentation

## 2019-04-07 DIAGNOSIS — R0602 Shortness of breath: Secondary | ICD-10-CM | POA: Diagnosis not present

## 2019-04-07 LAB — PROTIME-INR
INR: 1.1 (ref 0.8–1.2)
Prothrombin Time: 13.8 seconds (ref 11.4–15.2)

## 2019-04-07 LAB — BASIC METABOLIC PANEL
Anion gap: 13 (ref 5–15)
BUN: 9 mg/dL (ref 8–23)
CO2: 24 mmol/L (ref 22–32)
Calcium: 8.9 mg/dL (ref 8.9–10.3)
Chloride: 98 mmol/L (ref 98–111)
Creatinine, Ser: 0.95 mg/dL (ref 0.61–1.24)
GFR calc Af Amer: >60 mL/min (ref >60)
GFR calc non Af Amer: >60 mL/min (ref >60)
Glucose, Bld: 145 mg/dL — ABNORMAL HIGH (ref 70–99)
Potassium: 2.8 mmol/L — ABNORMAL LOW (ref 3.5–5.1)
Sodium: 135 mmol/L (ref 135–145)

## 2019-04-07 LAB — TROPONIN I: Troponin I: <0.03 ng/mL (ref <0.03)

## 2019-04-07 LAB — CBC
HCT: 30.4 % — ABNORMAL LOW (ref 39.0–52.0)
Hemoglobin: 9.2 g/dL — ABNORMAL LOW (ref 13.0–17.0)
MCH: 21.8 pg — ABNORMAL LOW (ref 26.0–34.0)
MCHC: 30.3 g/dL (ref 30.0–36.0)
MCV: 72 fL — ABNORMAL LOW (ref 80.0–100.0)
Platelets: 542 10*3/uL — ABNORMAL HIGH (ref 150–400)
RBC: 4.22 MIL/uL (ref 4.22–5.81)
RDW: 15.9 % — ABNORMAL HIGH (ref 11.5–15.5)
WBC: 10.2 10*3/uL (ref 4.0–10.5)
nRBC: 0 % (ref 0.0–0.2)

## 2019-04-07 LAB — APTT: aPTT: 29 seconds (ref 24–36)

## 2019-04-07 LAB — SARS CORONAVIRUS 2 BY RT PCR (HOSPITAL ORDER, PERFORMED IN ~~LOC~~ HOSPITAL LAB): SARS Coronavirus 2: NEGATIVE

## 2019-04-07 MED ORDER — SODIUM CHLORIDE 0.9% FLUSH: 3.0000 mL | Freq: Once | INTRAVENOUS | Status: DC

## 2019-04-07 MED ORDER — OXYMETAZOLINE HCL 0.05 % NA SOLN: 1.0000 | Freq: Once | NASAL | Status: DC

## 2019-04-07 MED ORDER — POTASSIUM CHLORIDE CRYS ER 20 MEQ PO TBCR
40.0000 meq | EXTENDED_RELEASE_TABLET | Freq: Once | ORAL | Status: AC
  Administered 2019-04-07: 16:00:00 40 meq via ORAL
  Filled 2019-04-07: qty 2

## 2019-04-07 NOTE — ED Provider Notes (Signed)
-----------------------------------------   2:50 PM on 04/07/2019 -----------------------------------------  Patient seen in conjunction with physician assistant Kerin Salen.  Overall the patient appears well, no distress.  He does state he feels somewhat short of breath but describes her shortness of breath as a feeling of mucus in his throat and upper airways not so much or shortness of breath in the chest.  Denies any chest pain.  Denies any fever, states minimal cough.  Patient does have a hemoglobin of 9.2 last check his hemoglobin was greater than 13 3 months ago.  Patient states he has noticed some darker stool and has been previously diagnosed with a gastric ulcer.  Patient is chest x-ray shows no acute findings.  Lab work otherwise largely nonrevealing besides mild hypokalemia.  Coronavirus test is pending.  If rectal exam shows melena patient will be started on Protonix infusion and admitted to the hospitalist service.  If rectal exam is negative anticipate patient could be discharged home with PCP follow-up.   Harvest Dark, MD 04/07/19 1452

## 2019-04-07 NOTE — ED Provider Notes (Signed)
[facility] Emergency Department Provider Note  ____________________________________________  Time seen: Approximately 1:47 PM  I have reviewed the triage vital signs and the nursing notes.   HISTORY  Chief Complaint Shortness of Breath    HPI Allen Terry is a 83 y.o. male that presents to the emergency department for evaluation of nasal congestion this week.  Patient states that he is having difficulty breathing due to nasal congestion of the right nostril and phlem in his throat. Symptoms are worse when he is lying down and he needs to stand up and walk around for the mucus to clear.  He has been taking Afrin for multiple days without relief.  He is also taking an allergy medication.  He was seen in the emergency department 4 days ago after a fall and diagnosed with a fractured wrist.  He has an appointment with Ortho tomorrow.  He has not fallen today.  He has some chronic abdominal pain due to an ulcer, nothing new today.  He lives alone.  No vomiting, diarrhea.   Past Medical History:  Diagnosis Date  . Acute lower GI bleeding 2015   "related to blood thinners"  . Anemia   . Bursitis of both shoulders   . CAD (coronary artery disease)    a. s/p PCI 01/17/2011 85% proximal RCA with DES. b. PCI 09/05/2014 60% mid LAD lesion, with DES  b. Cath 02/13/2016 80% ost OM2 treated with 2.7514 mm Resolute DES. d. Cath 12/2018 s/p DES to prox-mid LAD with residual 70% diagonal disease for medical therapy, EF 55-65%.  Allen Kitchen HTN (hypertension)    "not anymore" (02/12/2016)  . Hyperlipidemia   . Noncompliance   . Pneumonia    "double"  . Pre-diabetes   . Tuberculosis    "I've got 25% somewhere in my body" (02/12/2016)    Patient Active Problem List   Diagnosis Date Noted  . Pre-diabetes 01/02/2019  . Hypokalemia 01/02/2019  . Hyperlipidemia   . HTN (hypertension)   . CAD in native artery   . DOE (dyspnea on exertion)   . Unstable angina ([place]) 02/12/2016    Past  Surgical History:  Procedure Laterality Date  . CARDIAC CATHETERIZATION N/A 02/13/2016   Procedure: Left Heart Cath and Coronary Angiography;  Surgeon: Troy Sine, MD;  Location: [place] [facility];  Service: Cardiovascular;  Laterality: N/A;  . CARDIAC CATHETERIZATION N/A 02/13/2016   Procedure: Coronary Stent Intervention;  Surgeon: Troy Sine, MD;  Location: [place] [facility];  Service: Cardiovascular;  Laterality: N/A;  . CATARACT EXTRACTION W/ INTRAOCULAR LENS  IMPLANT, BILATERAL Bilateral   . CORONARY ANGIOPLASTY WITH STENT PLACEMENT     s/p PCI 01/17/2011 85% proximal RCA with DES, PCI 09/05/2014 60% mid LAD lesion, with DES   . CORONARY STENT INTERVENTION N/A 01/01/2019   Procedure: CORONARY STENT INTERVENTION;  Surgeon: Jettie Booze, MD;  Location: [place] [facility];  Service: Cardiovascular;  Laterality: N/A;  . LEFT HEART CATH AND CORONARY ANGIOGRAPHY N/A 01/01/2019   Procedure: LEFT HEART CATH AND CORONARY ANGIOGRAPHY;  Surgeon: Jettie Booze, MD;  Location: [place] [facility];  Service: Cardiovascular;  Laterality: N/A;  . TONSILLECTOMY    . UPPER GI ENDOSCOPY     s/p GI bleed related to [place]    Prior to Admission medications   Medication Sig Start Date End Date Taking? Authorizing Provider  aspirin EC 81 MG tablet Take 81 mg by mouth daily.     [provider]  atorvastatin (LIPITOR) 40 MG tablet Take 1 tablet (40 mg total) by mouth every evening. 01/02/19   Dunn, Tacey Ruizayna N, PA-C  carvedilol (COREG) 3.125 MG tablet Take 1 tablet (3.125 mg total) by mouth 2 (two) times daily with a meal. 01/02/19   Dunn, Tacey Ruizayna N, PA-C  ezetimibe (ZETIA) 10 MG tablet Take 1 tablet (10 mg total) by mouth daily. 01/03/19   Dunn, Tacey Ruizayna N, PA-C  nitroGLYCERIN (NITROSTAT) 0.4 MG SL tablet Place 1 tablet (0.4 mg total) under the tongue every 5 (five) minutes x 3 doses as needed for chest pain. 02/14/16   Azalee CourseMeng, Hao, PA  prasugrel (EFFIENT) 10 MG TABS tablet Take 1 tablet (10 mg  total) by mouth daily. 01/02/19   Dunn, Tacey Ruizayna N, PA-C  tamsulosin (FLOMAX) 0.4 MG CAPS capsule Take 0.4 mg by mouth daily.    [provider]  traMADol (ULTRAM) 50 MG tablet Take 1 tablet (50 mg total) by mouth every 6 (six) hours as needed. 04/03/19   Triplett, Rulon Eisenmengerari B, FNP    Allergies Ferrous sulfate; Meclizine; Metoprolol; Pravastatin; Ticagrelor; Lisinopril; and Penicillins  Family History  Problem Relation Age of Onset  . Arrhythmia Mother   . Arrhythmia Sister   . Stroke Father   . Hypertension Brother     Social History Social History   Tobacco Use  . Smoking status: Former Smoker    Types: Cigarettes  . Smokeless tobacco: Never Used  . Tobacco comment: "quit smoking cigarettes in ~ 2014"  Substance Use Topics  . Alcohol use: Yes    Comment: "quit drinking in ~ 1984; did have a problem w/alcohol at one time"  . Drug use: No     Review of Systems  Constitutional: No fever/chills Eyes: No visual changes. No discharge. ENT: Positive for congestion and rhinorrhea. Cardiovascular: No chest pain. Respiratory: Negative for cough. No SOB. Gastrointestinal: No new abdominal pain.  No nausea, no vomiting.  No diarrhea.  No constipation. Musculoskeletal: Negative for musculoskeletal pain. Skin: Negative for rash, abrasions, lacerations, ecchymosis. Neurological: Negative for headaches.   ____________________________________________   PHYSICAL EXAM:  VITAL SIGNS: ED Triage Vitals  Enc Vitals Group     BP 04/07/19 1010 (!) 168/72     Pulse Rate 04/07/19 1010 69     Resp 04/07/19 1010 16     Temp 04/07/19 1010 98.2 F (36.8 C)     Temp Source 04/07/19 1010 Oral     SpO2 04/07/19 1010 99 %     Weight 04/07/19 1009 191 lb 9.8 oz (86.9 kg)     Height 04/07/19 1009 5\' 8"  (1.727 m)     Head Circumference --      Peak Flow --      Pain Score 04/07/19 1009 0     Pain Loc --      Pain Edu? --      Excl. in GC? --      Constitutional: Alert and oriented. Well  appearing and in no acute distress. Eyes: Conjunctivae are normal. PERRL. EOMI. No discharge. Head: Atraumatic. ENT: No frontal and maxillary sinus tenderness.      Ears: Tympanic membranes pearly gray with good landmarks. No discharge.      Nose: Mild congestion/rhinnorhea.  Right nasal passage is edematous.      Mouth/Throat: Mucous membranes are moist. Oropharynx non-erythematous. Tonsils not enlarged. No exudates. Uvula midline. Neck: No stridor.   Hematological/Lymphatic/Immunilogical: No cervical lymphadenopathy. Cardiovascular: Normal rate, regular rhythm.  Good peripheral circulation.  Respiratory: Normal respiratory effort without tachypnea or retractions. Lungs CTAB. Good air entry to the bases with no decreased or absent breath sounds. Gastrointestinal: Bowel sounds 4 quadrants. Soft and nontender to palpation. No guarding or rigidity. No palpable masses. No distention. Musculoskeletal: Full range of motion to all extremities. No gross deformities appreciated. Neurologic:  Normal speech and language. No gross focal neurologic deficits are appreciated.  Skin:  Skin is warm, dry and intact. No rash noted. Psychiatric: Mood and affect are normal. Speech and behavior are normal. Patient exhibits appropriate insight and judgement.   ____________________________________________   LABS (all labs ordered are listed, but only abnormal results are displayed)  Labs Reviewed  BASIC METABOLIC PANEL - Abnormal; Notable for the following components:      Result Value   Potassium 2.8 (*)    Glucose, Bld 145 (*)    All other components within normal limits  CBC - Abnormal; Notable for the following components:   Hemoglobin 9.2 (*)    HCT 30.4 (*)    MCV 72.0 (*)    MCH 21.8 (*)    RDW 15.9 (*)    Platelets 542 (*)    All other components within normal limits  SARS CORONAVIRUS 2 (HOSPITAL ORDER, PERFORMED IN [facility])  PROTIME-INR  APTT  TROPONIN I    ____________________________________________  EKG   ____________________________________________  RADIOLOGY Lexine BatonI, Kimber Esterly, personally viewed and evaluated these images (plain radiographs) as part of my medical decision making, as well as reviewing the written report by the radiologist.  Dg Chest Port 1 View  Result Date: 04/07/2019 CLINICAL DATA:  Shortness of breath EXAM: PORTABLE CHEST 1 VIEW COMPARISON:  December 31, 2018. FINDINGS: There is minimal scarring in the bases. There is slight left apical pleural thickening. The lungs elsewhere are clear. Heart size and pulmonary vascularity are normal. No adenopathy. No bone lesions. IMPRESSION: Minimal bibasilar scarring. Slight asymmetric apical pleural thickening the left. No edema or consolidation. Heart size normal. No adenopathy evident. Electronically Signed   By: Bretta BangWilliam  Woodruff III M.D.   On: 04/07/2019 13:42    ____________________________________________    PROCEDURES  Procedure(s) performed:    Procedures    Medications  sodium chloride flush (NS) 0.9 % injection 3 mL (has no administration in time range)  potassium chloride SA (K-DUR) CR tablet 40 mEq (40 mEq Oral Given 04/07/19 1558)     ____________________________________________   INITIAL IMPRESSION / ASSESSMENT AND PLAN / ED COURSE  Pertinent labs & imaging results that were available during my care of the patient were reviewed by me and considered in my medical decision making (see chart for details).  Review of the [place] CSRS was performed in accordance of the [facility] prior to dispensing any controlled drugs.    Patient presented emergency department for evaluation of nasal congestion for 1 week. Vital signs and exam are reassuring.  Patient's hemoglobin and hematocrit are mildly decreased at 9.2 and 30.4.  He has been diagnosed with a gastric ulcer in the past. Hemoccult test is negative.  Patient has had some nasal congestion that is making it difficulty  to breathe out of his right nostril.  He denies any shortness of breath out of his chest.  Patient has been taking Afrin for an extended time.  He has been instructed to discontinue Afrin and begin Flonase.  He will also continue his allergy medicine.  Dr. Lenard LancePaduchowski has also evaluated the patient and agrees with plan of care.  Patient  feels well overall and is up pacing around the room.  He has an appointment with Ortho tomorrow.  Patient feels comfortable going home.  Patient is to follow up with primary care as needed or otherwise directed.  Patient will call Dr. Hyacinth MeekerMiller tomorrow for an appointment this week.  Patient is given ED precautions to return to the ED for any worsening or new symptoms.     ____________________________________________  FINAL CLINICAL IMPRESSION(S) / ED DIAGNOSES  Final diagnoses:  Nasal congestion      NEW MEDICATIONS STARTED DURING THIS VISIT:  ED Discharge Orders    None          This chart was dictated using voice recognition software/Dragon. Despite best efforts to proofread, errors can occur which can change the meaning. Any change was purely unintentional.    Enid DerryWagner, Kaziyah Parkison, PA-C 04/07/19 2015    Minna AntisPaduchowski, Kevin, MD 04/08/19 [phone]

## 2019-04-07 NOTE — ED Triage Notes (Signed)
C/O SOB x 1 week.  States fell on Monday and fractured left wrist.    AAOx3.  Skin warm and dry. NAD. No SOB/ DOE seen.

## 2019-04-07 NOTE — Discharge Instructions (Signed)
Please discontinue taking your Afrin.  You can continue taking Flonase.  Continue your allergy medication.  Please call Dr. Sabra Heck in the morning for an appointment tomorrow or Friday.  Please also follow-up with your Ortho appointment tomorrow.

## 2019-05-19 NOTE — Telephone Encounter (Signed)
Unable to contact   Mailing letter

## 2020-07-06 IMAGING — CR LEFT WRIST - COMPLETE 3+ VIEW
1 series · 4 of 4 positions shown · non-contrast
Comparison: 06/26/2016

CLINICAL DATA: Fall today with distal left radius/wrist pain.

EXAM:
LEFT WRIST - COMPLETE 3+ VIEW

[Series 1: dg wrist complete left · 0.14mm/px · 4 of 4 slices shown]
[im 1/4]
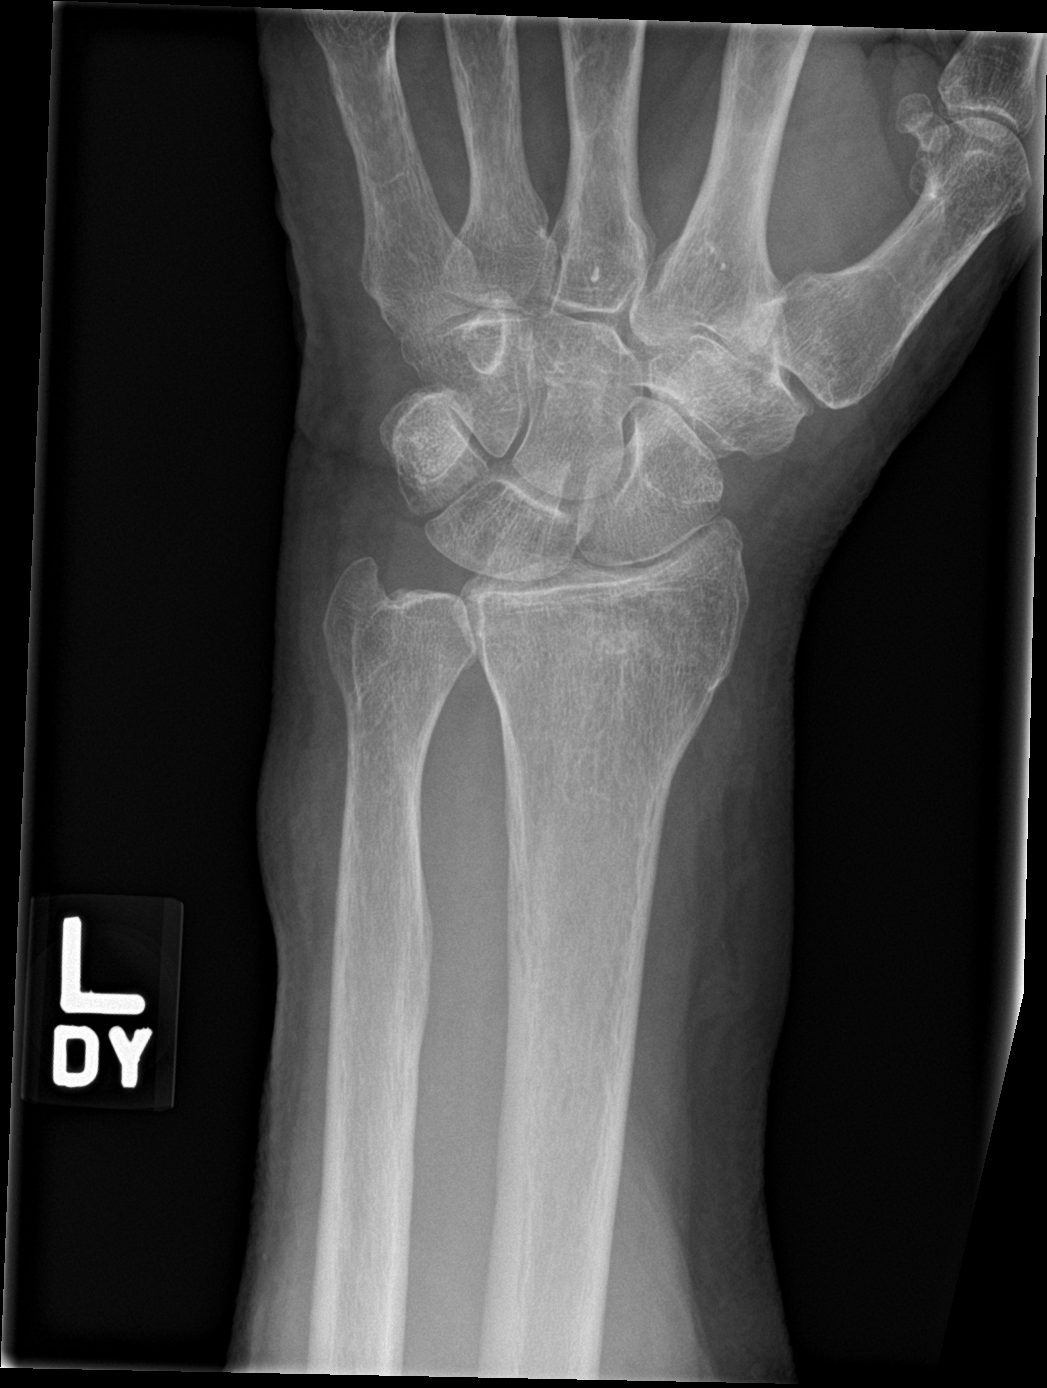
[im 2/4]
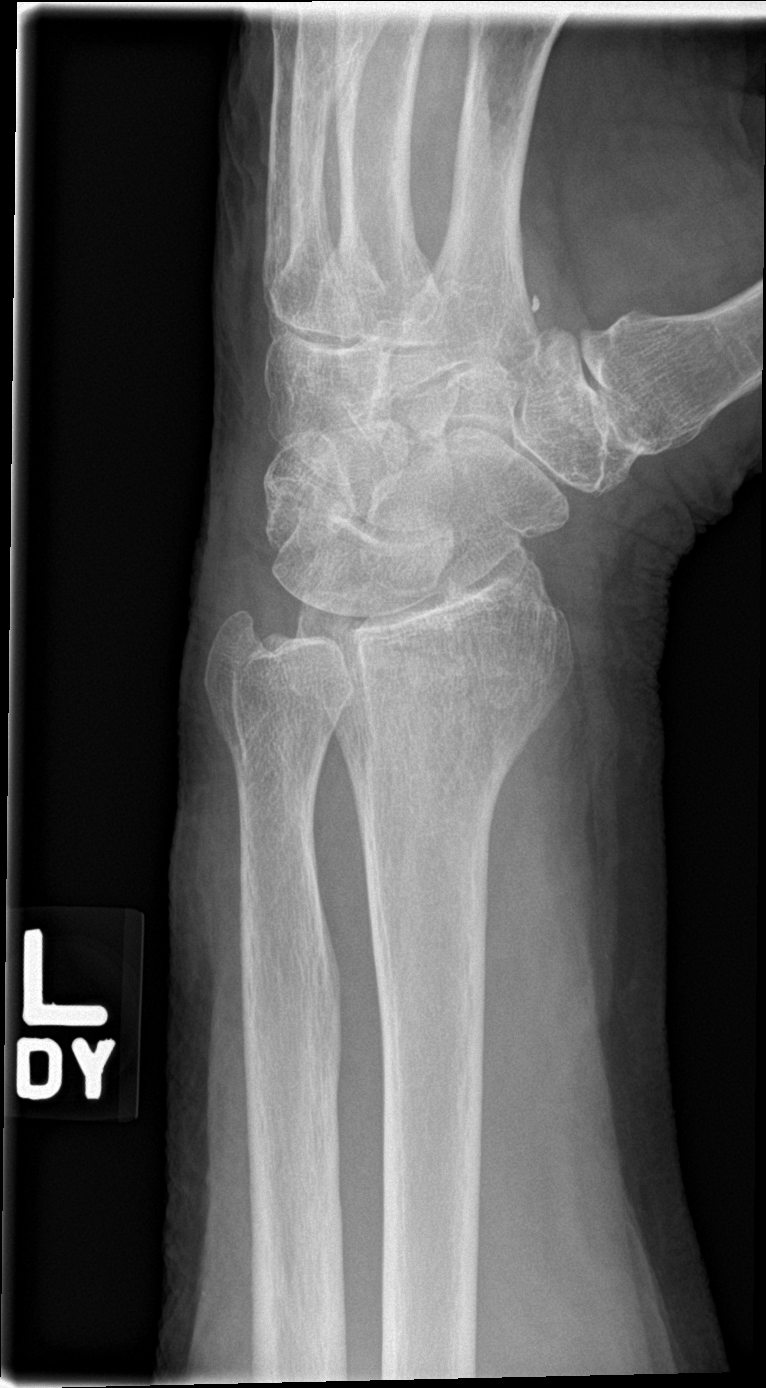
[im 3/4]
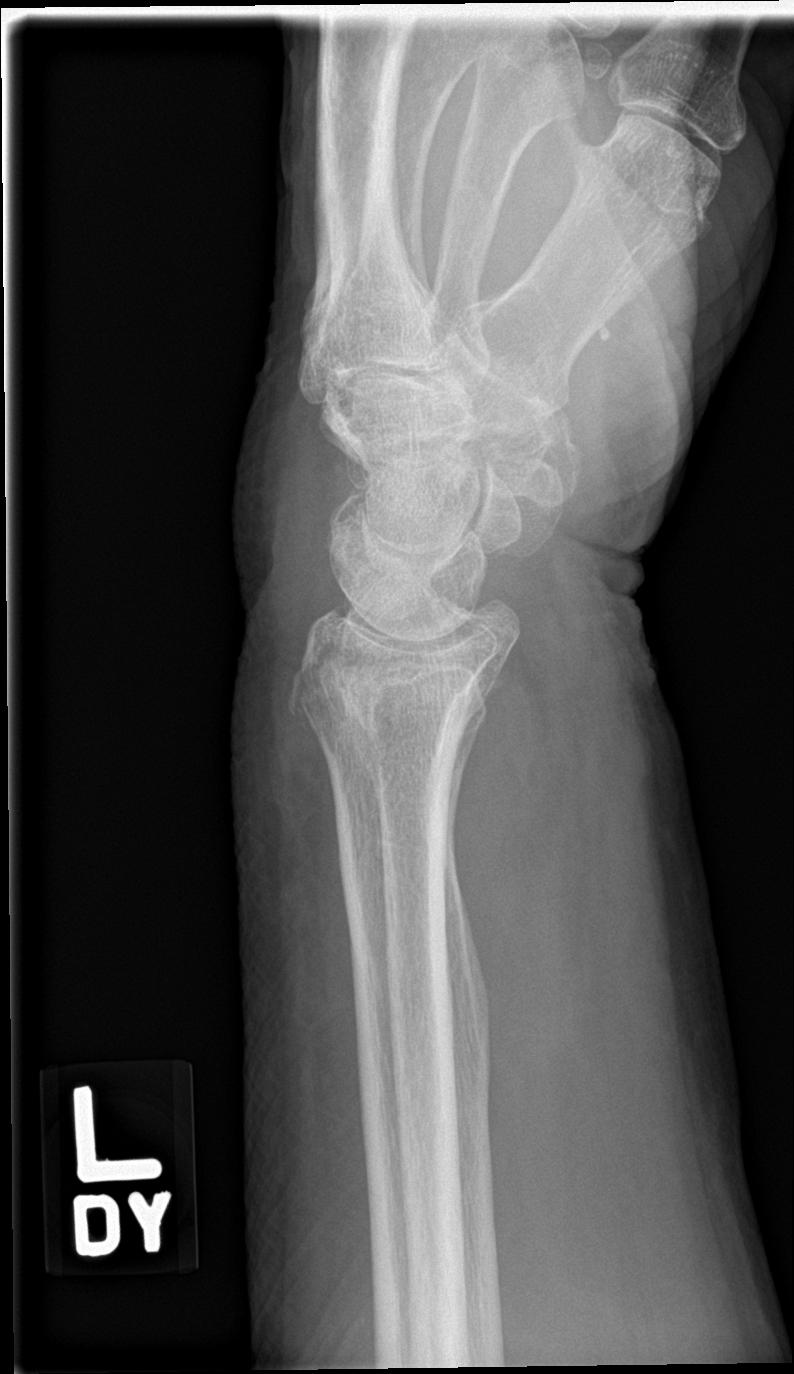
[im 4/4]
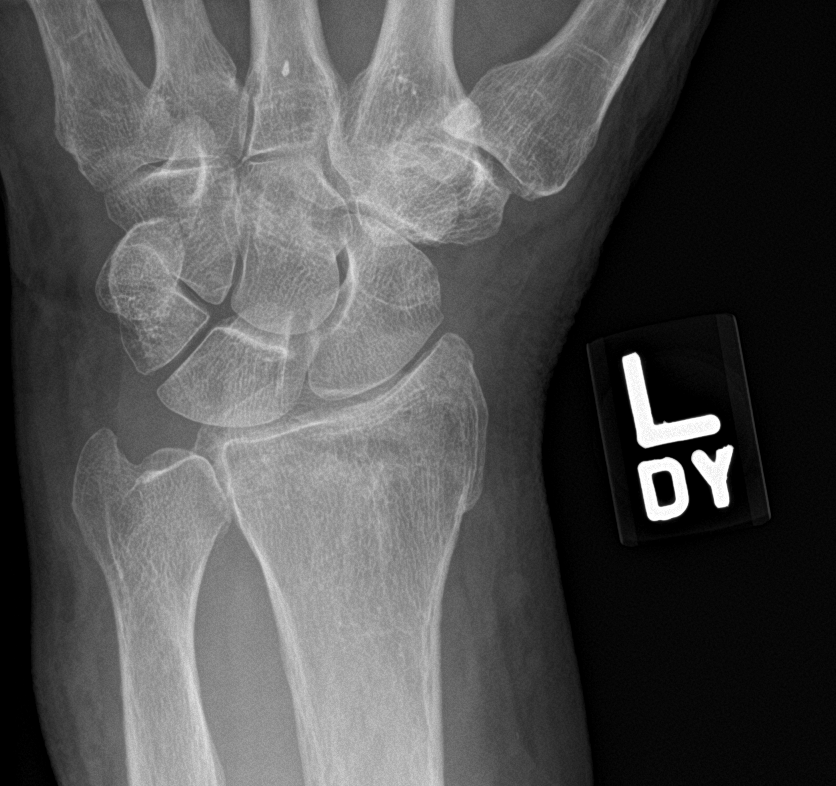

[4 of 4 positions shown; findings below may reference images not displayed]

FINDINGS: Mild degenerate change of the radiocarpal joint and radial side of
the carpal bones. Mild degenerate change of the first
carpometacarpal joint. On the lateral film, there is fracture along
the dorsal cortex of the distal radial metaphysis with suggestion of
extension to the articular surface. Remainder of the exam is
unremarkable.
IMPRESSION: Distal radial metaphyseal fracture without significant displacement.
Suggestion of extension of the fracture to the articular surface.

## 2020-12-24 ENCOUNTER — Emergency Department (HOSPITAL_COMMUNITY): Payer: Medicare Other

## 2020-12-24 ENCOUNTER — Observation Stay (HOSPITAL_COMMUNITY)
Admission: EM | Admit: 2020-12-24 | Discharge: 2020-12-26 | Disposition: A | Discharge status: Home / Self Care | Payer: Medicare Other | Attending: Internal Medicine | Admitting: Internal Medicine

## 2020-12-24 ENCOUNTER — Encounter (HOSPITAL_COMMUNITY): Payer: Self-pay | Admitting: Emergency Medicine

## 2020-12-24 ENCOUNTER — Other Ambulatory Visit: Payer: Self-pay

## 2020-12-24 DIAGNOSIS — I10 Essential (primary) hypertension: Secondary | ICD-10-CM | POA: Diagnosis present

## 2020-12-24 DIAGNOSIS — Z79899 Other long term (current) drug therapy: Secondary | ICD-10-CM | POA: Insufficient documentation

## 2020-12-24 DIAGNOSIS — I2510 Atherosclerotic heart disease of native coronary artery without angina pectoris: Secondary | ICD-10-CM | POA: Diagnosis not present

## 2020-12-24 DIAGNOSIS — Z7982 Long term (current) use of aspirin: Secondary | ICD-10-CM | POA: Diagnosis not present

## 2020-12-24 DIAGNOSIS — I200 Unstable angina: Secondary | ICD-10-CM

## 2020-12-24 DIAGNOSIS — R7303 Prediabetes: Secondary | ICD-10-CM | POA: Diagnosis present

## 2020-12-24 DIAGNOSIS — R0789 Other chest pain: Principal | ICD-10-CM | POA: Diagnosis present

## 2020-12-24 DIAGNOSIS — E876 Hypokalemia: Secondary | ICD-10-CM | POA: Diagnosis present

## 2020-12-24 DIAGNOSIS — Z20822 Contact with and (suspected) exposure to covid-19: Secondary | ICD-10-CM | POA: Insufficient documentation

## 2020-12-24 DIAGNOSIS — Z87891 Personal history of nicotine dependence: Secondary | ICD-10-CM | POA: Insufficient documentation

## 2020-12-24 DIAGNOSIS — D509 Iron deficiency anemia, unspecified: Secondary | ICD-10-CM

## 2020-12-24 DIAGNOSIS — I4891 Unspecified atrial fibrillation: Secondary | ICD-10-CM | POA: Diagnosis not present

## 2020-12-24 LAB — CBC
HCT: 34 % — ABNORMAL LOW (ref 39.0–52.0)
Hemoglobin: 9.3 g/dL — ABNORMAL LOW (ref 13.0–17.0)
MCH: 18.4 pg — ABNORMAL LOW (ref 26.0–34.0)
MCHC: 27.4 g/dL — ABNORMAL LOW (ref 30.0–36.0)
MCV: 67.2 fL — ABNORMAL LOW (ref 80.0–100.0)
Platelets: 585 10*3/uL — ABNORMAL HIGH (ref 150–400)
RBC: 5.06 MIL/uL (ref 4.22–5.81)
RDW: 18.3 % — ABNORMAL HIGH (ref 11.5–15.5)
WBC: 13.3 10*3/uL — ABNORMAL HIGH (ref 4.0–10.5)
nRBC: 0 % (ref 0.0–0.2)

## 2020-12-24 LAB — LIPID PANEL
Cholesterol: 141 mg/dL (ref 0–200)
HDL: 22 mg/dL — ABNORMAL LOW (ref >40)
LDL Cholesterol: 83 mg/dL (ref 0–99)
Total CHOL/HDL Ratio: 6.4 RATIO
Triglycerides: 178 mg/dL — ABNORMAL HIGH (ref <150)
VLDL: 36 mg/dL (ref 0–40)

## 2020-12-24 LAB — HEMOGLOBIN A1C
Hgb A1c MFr Bld: 6.5 % — ABNORMAL HIGH (ref 4.8–5.6)
Mean Plasma Glucose: 139.85 mg/dL

## 2020-12-24 LAB — TROPONIN I (HIGH SENSITIVITY)
Troponin I (High Sensitivity): 8 ng/L (ref <18)
Troponin I (High Sensitivity): 8 ng/L (ref <18)

## 2020-12-24 LAB — BASIC METABOLIC PANEL
Anion gap: 13 (ref 5–15)
BUN: 8 mg/dL (ref 8–23)
CO2: 25 mmol/L (ref 22–32)
Calcium: 9 mg/dL (ref 8.9–10.3)
Chloride: 97 mmol/L — ABNORMAL LOW (ref 98–111)
Creatinine, Ser: 1.05 mg/dL (ref 0.61–1.24)
GFR, Estimated: >60 mL/min (ref >60)
Glucose, Bld: 164 mg/dL — ABNORMAL HIGH (ref 70–99)
Potassium: 2.8 mmol/L — ABNORMAL LOW (ref 3.5–5.1)
Sodium: 135 mmol/L (ref 135–145)

## 2020-12-24 LAB — TSH: TSH: 2.766 u[IU]/mL (ref 0.350–4.500)

## 2020-12-24 MED ORDER — HEPARIN (PORCINE) 25000 UT/250ML-% IV SOLN
950.0000 [IU]/h | INTRAVENOUS | Status: DC
  Administered 2020-12-24: 800 [IU]/h via INTRAVENOUS
  Filled 2020-12-24: qty 250

## 2020-12-24 MED ORDER — DILTIAZEM HCL ER COATED BEADS 120 MG PO CP24
120.0000 mg | ORAL_CAPSULE | Freq: Every day | ORAL | Status: DC
Start: 2020-12-24 — End: 2020-12-26
  Administered 2020-12-24 – 2020-12-26 (×3): 120 mg via ORAL
  Filled 2020-12-24 (×4): qty 1

## 2020-12-24 MED ORDER — ISOSORBIDE MONONITRATE ER 30 MG PO TB24
30.0000 mg | ORAL_TABLET | Freq: Every day | ORAL | Status: DC
  Administered 2020-12-25 – 2020-12-26 (×2): 30 mg via ORAL
  Filled 2020-12-24 (×2): qty 1

## 2020-12-24 MED ORDER — CARVEDILOL 3.125 MG PO TABS
3.1250 mg | ORAL_TABLET | Freq: Two times a day (BID) | ORAL | Status: DC
  Filled 2020-12-24: qty 1

## 2020-12-24 MED ORDER — PANTOPRAZOLE SODIUM 40 MG PO TBEC
40.0000 mg | DELAYED_RELEASE_TABLET | Freq: Every day | ORAL | Status: DC
  Administered 2020-12-24 – 2020-12-26 (×3): 40 mg via ORAL
  Filled 2020-12-24 (×3): qty 1

## 2020-12-24 MED ORDER — DILTIAZEM HCL 25 MG/5ML IV SOLN
20.0000 mg | Freq: Once | INTRAVENOUS | Status: AC
  Administered 2020-12-24: 20 mg via INTRAVENOUS
  Filled 2020-12-24: qty 5

## 2020-12-24 MED ORDER — ASPIRIN EC 81 MG PO TBEC
81.0000 mg | DELAYED_RELEASE_TABLET | Freq: Every day | ORAL | Status: DC
  Administered 2020-12-25 – 2020-12-26 (×2): 81 mg via ORAL
  Filled 2020-12-24 (×2): qty 1

## 2020-12-24 MED ORDER — POTASSIUM CHLORIDE 10 MEQ/100ML IV SOLN
10.0000 meq | Freq: Once | INTRAVENOUS | Status: AC
  Administered 2020-12-24: 10 meq via INTRAVENOUS
  Filled 2020-12-24: qty 100

## 2020-12-24 MED ORDER — ENOXAPARIN SODIUM 40 MG/0.4ML ~~LOC~~ SOLN: 40.0000 mg | SUBCUTANEOUS | Status: DC

## 2020-12-24 MED ORDER — NITROGLYCERIN 2 % TD OINT
1.0000 [in_us] | TOPICAL_OINTMENT | Freq: Once | TRANSDERMAL | Status: AC
  Administered 2020-12-24: 1 [in_us] via TOPICAL
  Filled 2020-12-24: qty 1

## 2020-12-24 MED ORDER — KETOROLAC TROMETHAMINE 15 MG/ML IJ SOLN
15.0000 mg | Freq: Once | INTRAMUSCULAR | Status: AC
  Administered 2020-12-24: 15 mg via INTRAVENOUS
  Filled 2020-12-24: qty 1

## 2020-12-24 MED ORDER — ASPIRIN 81 MG PO CHEW
324.0000 mg | CHEWABLE_TABLET | Freq: Once | ORAL | Status: AC
  Administered 2020-12-24: 324 mg via ORAL
  Filled 2020-12-24: qty 4

## 2020-12-24 MED ORDER — ACETAMINOPHEN 325 MG PO TABS: 650.0000 mg | ORAL_TABLET | ORAL | Status: DC | PRN

## 2020-12-24 MED ORDER — ONDANSETRON HCL 4 MG/2ML IJ SOLN: 4.0000 mg | Freq: Four times a day (QID) | INTRAMUSCULAR | Status: DC | PRN

## 2020-12-24 MED ORDER — MAGNESIUM SULFATE 2 GM/50ML IV SOLN
2.0000 g | INTRAVENOUS | Status: AC
  Administered 2020-12-24: 2 g via INTRAVENOUS
  Filled 2020-12-24: qty 50

## 2020-12-24 NOTE — Progress Notes (Signed)
ANTICOAGULATION CONSULT NOTE  Pharmacy Consult for heparin Indication: chest pain/ACS  Allergies  Allergen Reactions  . Ferrous Sulfate Other (See Comments)    Face burning  . Meclizine Other (See Comments)    Burning in face when taking iron  . Metoprolol Other (See Comments)    abd pain  . Pravastatin Other (See Comments)    Myalgias, even when combined with coenzyme q 10  . Ticagrelor Other (See Comments)    Cough, sneezing and nasal congestion  . Lisinopril Cough  . Penicillins Hives and Rash    Did it involve swelling of the face/tongue/throat, SOB, or low BP? No Did it involve sudden or severe rash/hives, skin peeling, or any reaction on the inside of your mouth or nose? No Did you need to seek medical attention at a hospital or doctor's office? Yes When did it last happen?40 years If all above answers are "NO", may proceed with cephalosporin use.    Patient Measurements:   Heparin Dosing Weight: 65.9 kg  Vital Signs: Temp: 98.1 F (36.7 C) (02/27 1649) Temp Source: Oral (02/27 1649) BP: 98/68 (02/27 2100) Pulse Rate: 82 (02/27 2100)  Labs: Recent Labs    12/24/20 1652 12/24/20 2005  HGB 9.3*  --   HCT 34.0*  --   PLT 585*  --   CREATININE 1.05  --   TROPONINIHS 8 8    CrCl cannot be calculated (Unknown ideal weight.).   Medical History: Past Medical History:  Diagnosis Date  . Acute lower GI bleeding 2015   "related to blood thinners"  . Anemia   . Bursitis of both shoulders   . CAD (coronary artery disease)    a. s/p PCI 01/17/2011 85% proximal RCA with DES. b. PCI 09/05/2014 60% mid LAD lesion, with DES  b. Cath 02/13/2016 80% ost OM2 treated with 2.7514 mm Resolute DES. d. Cath 12/2018 s/p DES to prox-mid LAD with residual 70% diagonal disease for medical therapy, EF 55-65%.  Marland Kitchen HTN (hypertension)    "not anymore" (02/12/2016)  . Hyperlipidemia   . Noncompliance   . Pneumonia    "double"  . Pre-diabetes   . Tuberculosis    "I've got 25%  somewhere in my body" (02/12/2016)     Medications:  Scheduled:  . [START ON 12/25/2020] aspirin EC  81 mg Oral Daily  . diltiazem  120 mg Oral Daily  . pantoprazole  40 mg Oral Daily    Assessment: Patient is a 18 yom that is being admitted for chest pain. Pharmacy has been asked to dose heparin at this time for ACS however has been asked not to bolus the patient as they have a hx of GIB and gastric AVM.   Goal of Therapy:  Heparin level 0.3-0.7 units/ml Monitor platelets by anticoagulation protocol: Yes   Plan:  - Start Heparin drip @ 800 units/hr - Heparin level in ~ 6 hours  - Monitor patient for s/s of bleeding and CBC while on heparin   Joaquim Lai PharmD. BCPS  12/24/2020,9:22 PM

## 2020-12-24 NOTE — H&P (Signed)
History and Physical    Allen Terry XTK:[id number] DOB: 10-16-1936 DOA: 12/24/2020  PCP: Pcp, No  Patient coming from: Home, nephew at bedside  I have personally briefly reviewed patient's old medical records in [facility]  Chief Complaint: worsening chest pain   HPI: Allen Terry is a 85 y.o. male with medical history significant for CAD s/p DES x4, atrial fibrillation not on anticoagulation due to history of GI bleed, gastric AVM and intolerance to DAPT, hypertension, hyperlipidemia, iron deficiency anemia, and prediabetes who presents with concerns of worsening chest pain.  For the past 2 weeks he has noticed ongoing waxing and waning left-sided chest pain with radiation up his left shoulder and to his back.  Pain occurs both at rest and with exertion.  Also associated with shortness of breath.  States in the last several days this has gotten acutely worse.  Has had some lower extremity edema bilaterally that he has been taking as needed Lasix for.  Denies any orthopnea or PND.  He has documented history of noncompliance with medication but endorsed to me that he has been taking all of his medicines.    Patient does not have a primary cardiologist.   Last catheterization was in 3/20 with patent 2nd marginal and right RCA stent with restenosis of the proximal and mid LAD which were restented.  There was 70% ostial first-degree lesion that was not intervened upon.  He remained on DAPT for 3 months afterwards but was taken off ticagrelor secondary to GI bleed.    Last hospitalization in November 2021 at [facility] for unstable angina.  PET stress test was attempted during that admission but was unable to be completed secondary to neck and back pain.  No repeat catheterization was done since patient would not tolerate necessary DAPT post stent placement since he has history of GI bleed with Ticagrelor.   Currently still has dull chest pain with nitroglycerin patch.    ED Course: He was  noted to be in atrial fibrillation with RVR with rates up to 140 which improved following IV 20 mg of diltiazem. Currently still has dull chest pain with nitroglycerin patch.  CBC shows mild leukocytosis of 13.3, stable anemia of 9.3, hypokalemia 2.8, mild hyperglycemia 164.  No IV heparin was initially initiated by ED physician due to his history of GI bleed.  Review of Systems: Constitutional: No Weight Change, No Fever ENT/Mouth: No sore throat, No Rhinorrhea Eyes: No Eye Pain, No Vision Changes Cardiovascular: + Chest Pain, + SOB, No PND, No Dyspnea on Exertion, No Orthopnea, + Edema, No Palpitations Respiratory: No Cough, No Sputum, Gastrointestinal: No Nausea, No Vomiting, No Diarrhea, No Constipation, No Pain Genitourinary: no Urinary Incontinence, No Urgency, No Flank Pain Musculoskeletal: No Arthralgias, No Myalgias Skin: No Skin Lesions, No Pruritus, Neuro: no Weakness, No Numbness Psych: No Anxiety/Panic, No Depression, no decrease appetite Heme/Lymph: No Bruising, No Bleeding  Past Medical History:  Diagnosis Date  . Acute lower GI bleeding 2015   "related to blood thinners"  . Anemia   . Bursitis of both shoulders   . CAD (coronary artery disease)    a. s/p PCI 01/17/2011 85% proximal RCA with DES. b. PCI 09/05/2014 60% mid LAD lesion, with DES  b. Cath 02/13/2016 80% ost OM2 treated with 2.7514 mm Resolute DES. d. Cath 12/2018 s/p DES to prox-mid LAD with residual 70% diagonal disease for medical therapy, EF 55-65%.  Marland Kitchen HTN (hypertension)    "not anymore" (02/12/2016)  .  Hyperlipidemia   . Noncompliance   . Pneumonia    "double"  . Pre-diabetes   . Tuberculosis    "I've got 25% somewhere in my body" (02/12/2016)    Past Surgical History:  Procedure Laterality Date  . CARDIAC CATHETERIZATION N/A 02/13/2016   Procedure: Left Heart Cath and Coronary Angiography;  Surgeon: Lennette Bihari, MD;  Location: [facility] INVASIVE CV LAB;  Service: Cardiovascular;  Laterality: N/A;  .  CARDIAC CATHETERIZATION N/A 02/13/2016   Procedure: Coronary Stent Intervention;  Surgeon: Lennette Bihari, MD;  Location: [facility] INVASIVE CV LAB;  Service: Cardiovascular;  Laterality: N/A;  . CATARACT EXTRACTION W/ INTRAOCULAR LENS  IMPLANT, BILATERAL Bilateral   . CORONARY ANGIOPLASTY WITH STENT PLACEMENT     s/p PCI 01/17/2011 85% proximal RCA with DES, PCI 09/05/2014 60% mid LAD lesion, with DES   . CORONARY STENT INTERVENTION N/A 01/01/2019   Procedure: CORONARY STENT INTERVENTION;  Surgeon: Corky Crafts, MD;  Location: [facility] INVASIVE CV LAB;  Service: Cardiovascular;  Laterality: N/A;  . LEFT HEART CATH AND CORONARY ANGIOGRAPHY N/A 01/01/2019   Procedure: LEFT HEART CATH AND CORONARY ANGIOGRAPHY;  Surgeon: Corky Crafts, MD;  Location: [facility] INVASIVE CV LAB;  Service: Cardiovascular;  Laterality: N/A;  . TONSILLECTOMY    . UPPER GI ENDOSCOPY     s/p GI bleed related to [place]il[place]     reports that he has quit smoking. His smoking use included cigarettes. He has never used smokeless tobacco. He reports current alcohol use. He reports that he does not use drugs. Social History  Allergies  Allergen Reactions  . Ferrous Sulfate Other (See Comments)    Face burning  . Meclizine Other (See Comments)    Burning in face when taking iron  . Metoprolol Other (See Comments)    abd pain  . Pravastatin Other (See Comments)    Myalgias, even when combined with coenzyme q 10  . Ticagrelor Other (See Comments)    Cough, sneezing and nasal congestion  . Lisinopril Cough  . Penicillins Hives and Rash    Did it involve swelling of the face/tongue/throat, SOB, or low BP? No Did it involve sudden or severe rash/hives, skin peeling, or any reaction on the inside of your mouth or nose? No Did you need to seek medical attention at a hospital or doctor's office? Yes When did it last happen?40 years If all above answers are "NO", may proceed with cephalosporin use.    Family History  Problem  Relation Age of Onset  . Arrhythmia Mother   . Arrhythmia Sister   . Stroke Father   . Hypertension Brother      Prior to Admission medications   Medication Sig Start Date End Date Taking? Authorizing Provider  aspirin EC 81 MG tablet Take 81 mg by mouth daily.    Yes [provider]  carvedilol (COREG) 3.125 MG tablet Take 1 tablet (3.125 mg total) by mouth 2 (two) times daily with a meal. 01/02/19  Yes Dunn, Dayna N, PA-C  diltiazem (DILACOR XR) 120 MG 24 hr capsule Take 120 mg by mouth daily.   Yes [provider]  finasteride (PROSCAR) 5 MG tablet Take 5 mg by mouth daily. 12/21/20 12/21/21 Yes [provider]  pantoprazole (PROTONIX) 40 MG tablet Take 40 mg by mouth daily.   Yes [provider]  tamsulosin (FLOMAX) 0.4 MG CAPS capsule Take 0.8 capsules by mouth daily. 10/28/20  Yes [provider]  nitroGLYCERIN (NITROSTAT) 0.4 MG SL  tablet Place 1 tablet (0.4 mg total) under the tongue every 5 (five) minutes x 3 doses as needed for chest pain. 02/14/16   Azalee CourseMeng, Hao, PA    Physical Exam: Vitals:   12/24/20 1737 12/24/20 1800 12/24/20 1815 12/24/20 1903  BP: (!) 137/92 116/74 118/75 125/81  Pulse: (!) 107 (!) 142 (!) 106 (!) 107  Resp: 15 (!) 21 (!) 21 (!) 22  Temp:      TempSrc:      SpO2: 100% 100% 100% 98%    Constitutional: NAD, calm, comfortable, elderly obese gentleman laying on the edge of the bed at 20 degree incline Vitals:   12/24/20 1737 12/24/20 1800 12/24/20 1815 12/24/20 1903  BP: (!) 137/92 116/74 118/75 125/81  Pulse: (!) 107 (!) 142 (!) 106 (!) 107  Resp: 15 (!) 21 (!) 21 (!) 22  Temp:      TempSrc:      SpO2: 100% 100% 100% 98%   Eyes: PERRL, lids and conjunctivae normal ENMT: Mucous membranes are moist.  Neck: normal, supple Respiratory: clear to auscultation bilaterally, no wheezing, no crackles. Normal respiratory effort on ambient air. No accessory muscle use.  Cardiovascular: Irregularly irregular rate and  rhythm, no murmurs / rubs / gallops.  Bilateral nonpitting edema of the ankles.   Abdomen: no tenderness, no masses palpated. Bowel sounds positive.  Musculoskeletal: no clubbing / cyanosis. No joint deformity upper and lower extremities. Good ROM, no contractures. Normal muscle tone.  Skin: no rashes, lesions, ulcers. No induration Neurologic: CN 2-12 grossly intact. Sensation intact, Strength 5/5 in all 4.  Psychiatric: Normal judgment and insight. Alert and oriented x 3. Normal mood.     Labs on Admission: I have personally reviewed following labs and imaging studies  CBC: Recent Labs  Lab 12/24/20 1652  WBC 13.3*  HGB 9.3*  HCT 34.0*  MCV 67.2*  PLT 585*   Basic Metabolic Panel: Recent Labs  Lab 12/24/20 1652  NA 135  K 2.8*  CL 97*  CO2 25  GLUCOSE 164*  BUN 8  CREATININE 1.05  CALCIUM 9.0   GFR: CrCl cannot be calculated (Unknown ideal weight.). Liver Function Tests: No results for input(s): AST, ALT, ALKPHOS, BILITOT, PROT, ALBUMIN in the last 168 hours. No results for input(s): LIPASE, AMYLASE in the last 168 hours. No results for input(s): AMMONIA in the last 168 hours. Coagulation Profile: No results for input(s): INR, PROTIME in the last 168 hours. Cardiac Enzymes: No results for input(s): CKTOTAL, CKMB, CKMBINDEX, TROPONINI in the last 168 hours. BNP (last 3 results) No results for input(s): PROBNP in the last 8760 hours. HbA1C: No results for input(s): HGBA1C in the last 72 hours. CBG: No results for input(s): GLUCAP in the last 168 hours. Lipid Profile: No results for input(s): CHOL, HDL, LDLCALC, TRIG, CHOLHDL, LDLDIRECT in the last 72 hours. Thyroid Function Tests: No results for input(s): TSH, T4TOTAL, FREET4, T3FREE, THYROIDAB in the last 72 hours. Anemia Panel: No results for input(s): VITAMINB12, FOLATE, FERRITIN, TIBC, IRON, RETICCTPCT in the last 72 hours. Urine analysis: No results found for: COLORURINE, APPEARANCEUR, LABSPEC, PHURINE,  GLUCOSEU, HGBUR, BILIRUBINUR, KETONESUR, PROTEINUR, UROBILINOGEN, NITRITE, LEUKOCYTESUR  Radiological Exams on Admission: DG Chest 2 View  Result Date: 12/24/2020 CLINICAL DATA:  Chest pain and shortness of breath. Coronary artery disease. EXAM: CHEST - 2 VIEW COMPARISON:  04/07/2019 FINDINGS: The heart size and mediastinal contours are within normal limits. Coronary artery stent again noted. Both lungs are clear. The visualized skeletal structures are  unremarkable. IMPRESSION: No active cardiopulmonary disease. Electronically Signed   By: Danae Orleans M.D.   On: 12/24/2020 17:17      Assessment/Plan  Unstable angina  - Last catheterization was in 3/20 with patent 2nd marginal and right RCA stent with restenosis of the proximal and mid LAD which were restented.  There was 70% ostial first-degree lesion that was not intervened upon.  He remained on DAPT for 3 months afterwards but was taken off ticagrelor secondary to GI bleed.  - Last hospitalization in November 2021 at [facility] for unstable angina.  PET stress test was attempted during that admission but was unable to be completed secondary to neck and back pain.  No repeat catheterization was done since patient would not tolerate necessary DAPT post stent placement since he has history of GI bleed with Ticagrelor.  - Pt denies any recent GI bleeding or melena - Discussed case with cardiology Fellow Dr. Brayton Layman who recommends starting heparin without bolus and will evaluate pt tonight.  Appreciate recommendations. - obtain Echo   Atrial fibrillation with RVR  Rates up to 140 in ED Improved with 20mg  of IV diltizem in the ED  continous telemetry  contine home Diltizem  Not anticoagulated due to history of GI bleed, gastric AVM  Hypokalemia Replete.  Magnesium also repleted in ED. Follow with repeat BMP in the morning  HTN Continue diltiazem  Iron deficiency anemia with history of GI bleed/gastric AVM Hemoglobin stable from a year ago  at 9.3.  No recent GI bleed or melena. Patient has not taken any iron supplementation.  Repeat iron panel.  Has history of requiring IV iron infusion  Hyperlipidemia noted in past documentation to have ?intolerance to statin  Prediabetes Check HA1c  DVT prophylaxis:.Heparin infusion  code Status: Full Family Communication: Plan discussed with patient at bedside  disposition Plan: Home with observation Consults called:  Admission status: Observation  Level of care: Telemetry Cardiac  Status is: Observation  The patient remains OBS appropriate and will d/c before 2 midnights.  Dispo: The patient is from: Home              Anticipated d/c is to: Home              Patient currently is not medically stable to d/c.   Difficult to place patient No         DO [facility]   If 7PM-7AM, please contact night-coverage [email or web address]   12/24/2020, 8:14 PM

## 2020-12-24 NOTE — ED Provider Notes (Signed)
[facility] Provider Note   CSN: [id number] Arrival date & time: 12/24/20  1641     History Chief Complaint  Patient presents with  . Chest Pain    [name] is a 85 y.o. male.  HPI   This patient is an 85 year old male with a prior history of coronary disease status post multiple stents most recently in March 2020, he also has a history of hyperlipidemia, hypertension, he has a history of prediabetes and a history of gastrointestinal bleeding.  He was recently diagnosed with persistent atrial fibrillation but because of history of GI bleeding with persistent blood loss anemia antiplatelet agents and anticoagulants have been avoided.  The patient is unsure what medications he takes but states that he is taken all of his medicines today.  He is not followed by a local family doctor.  He has multiple different doctors that he sees outside of the system.  He cannot tell me names.  He reports that over the last 2 weeks he has had some increasing shortness of breath, he occasionally has some pain that radiates into from his chest down his left arm, the shortness of breath and the pain seems to be getting progressively worse and today it was much worse.  No fevers, no numbness, no nausea or vomiting, no diaphoresis.  At this time the symptoms are mild but persistent.  He has an occasional cough.  He endorses swelling in his bilateral legs but is taking his Lasix.  He is unsure what anticoagulants he takes  After reviewing the medical record his last internal medicine visit in the last several months they reported that he was not taking antiplatelets or anticoagulants because of the GI losses  Past Medical History:  Diagnosis Date  . Acute lower GI bleeding 2015   "related to blood thinners"  . Anemia   . Bursitis of both shoulders   . CAD (coronary artery disease)    a. s/p PCI 01/17/2011 85% proximal RCA with DES. b. PCI 09/05/2014 60% mid LAD  lesion, with DES  b. Cath 02/13/2016 80% ost OM2 treated with 2.7514 mm Resolute DES. d. Cath 12/2018 s/p DES to prox-mid LAD with residual 70% diagonal disease for medical therapy, EF 55-65%.  Marland Kitchen. HTN (hypertension)    "not anymore" (02/12/2016)  . Hyperlipidemia   . Noncompliance   . Pneumonia    "double"  . Pre-diabetes   . Tuberculosis    "I've got 25% somewhere in my body" (02/12/2016)    Patient Active Problem List   Diagnosis Date Noted  . Pre-diabetes 01/02/2019  . Hypokalemia 01/02/2019  . Hyperlipidemia   . HTN (hypertension)   . CAD in native artery   . DOE (dyspnea on exertion)   . Unstable angina (HCC) 02/12/2016    Past Surgical History:  Procedure Laterality Date  . CARDIAC CATHETERIZATION N/A 02/13/2016   Procedure: Left Heart Cath and Coronary Angiography;  Surgeon: Lennette Biharihomas A Kelly, MD;  [facility];  Service: Cardiovascular;  Laterality: N/A;  . CARDIAC CATHETERIZATION N/A 02/13/2016   Procedure: Coronary Stent Intervention;  Surgeon: Lennette Biharihomas A Kelly, MD;  Location: [facility] INVASIVE CV LAB;  Service: Cardiovascular;  Laterality: N/A;  . CATARACT EXTRACTION W/ INTRAOCULAR LENS  IMPLANT, BILATERAL Bilateral   . CORONARY ANGIOPLASTY WITH STENT PLACEMENT     s/p PCI 01/17/2011 85% proximal RCA with DES, PCI 09/05/2014 60% mid LAD lesion, with DES   . CORONARY STENT INTERVENTION N/A 01/01/2019  Procedure: CORONARY STENT INTERVENTION;  Surgeon: Corky Crafts, MD;  Location: [facility] INVASIVE CV LAB;  Service: Cardiovascular;  Laterality: N/A;  . LEFT HEART CATH AND CORONARY ANGIOGRAPHY N/A 01/01/2019   Procedure: LEFT HEART CATH AND CORONARY ANGIOGRAPHY;  Surgeon: Corky Crafts, MD;  Location: [facility] INVAS[facility] CV LAB;  Service: Cardiovascular;  Laterality: N/A;  . TONSILLECTOMY    . UPPER GI ENDOSCOPY     s/p GI bleed related to Brilenta       Family History  Problem Relation Age of Onset  . Arrhythmia Mother   . Arrhythmia Sister   . Stroke Father   .  Hypertension Brother     Social History   Tobacco Use  . Smoking status: Former Smoker    Types: Cigarettes  . Smokeless tobacco: Never Used  . Tobacco comment: "quit smoking cigarettes in ~ 2014"  Substance Use Topics  . Alcohol use: Yes    Comment: "quit drinking in ~ 1984; did have a problem w/alcohol at one time"  . Drug use: No    Home Medications Prior to Admission medications   Medication Sig Start Date End Date Taking? Authorizing Provider  aspirin EC 81 MG tablet Take 81 mg by mouth daily.    Yes [provider]  carvedilol (COREG) 3.125 MG tablet Take 1 tablet (3.125 mg total) by mouth 2 (two) times daily with a meal. 01/02/19  Yes Dunn, Dayna N, PA-C  diltiazem (DILACOR XR) 120 MG 24 hr capsule Take 120 mg by mouth daily.   Yes [provider]  finasteride (PROSCAR) 5 MG tablet Take 5 mg by mouth daily. 12/21/20 12/21/21 Yes [provider]  pantoprazole (PROTONIX) 40 MG tablet Take 40 mg by mouth daily.   Yes [provider]  tamsulosin (FLOMAX) 0.4 MG CAPS capsule Take 0.8 capsules by mouth daily. 10/28/20  Yes [provider]  nitroGLYCERIN (NITROSTAT) 0.4 MG SL tablet Place 1 tablet (0.4 mg total) under the tongue every 5 (five) minutes x 3 doses as needed for chest pain. 02/14/16   Azalee Course, PA    Allergies    Ferrous sulfate, Meclizine, Metoprolol, Pravastatin, Ticagrelor, Lisinopril, and Penicillins  Review of Systems   Review of Systems  All other systems reviewed and are negative.   Physical Exam Updated Vital Signs BP 118/75 (BP Location: Right Arm)   Pulse (!) 106   Temp 98.1 F (36.7 C) (Oral)   Resp (!) 21   SpO2 100%   Physical Exam Vitals and nursing note reviewed.  Constitutional:      General: He is not in acute distress.    Appearance: He is well-developed and well-nourished.  HENT:     Head: Normocephalic and atraumatic.     Mouth/Throat:     Mouth: Oropharynx is clear and moist.     Pharynx: No  oropharyngeal exudate.  Eyes:     General: No scleral icterus.       Right eye: No discharge.        Left eye: No discharge.     Extraocular Movements: EOM normal.     Conjunctiva/sclera: Conjunctivae normal.     Pupils: Pupils are equal, round, and reactive to light.  Neck:     Thyroid: No thyromegaly.     Vascular: No JVD.  Cardiovascular:     Rate and Rhythm: Tachycardia present. Rhythm irregular.     Pulses: Intact distal pulses.     Heart sounds: Normal heart sounds. No murmur heard.  No friction rub. No gallop.      Comments: Atrial fibrillation with a rate of 100-110 in general Pulmonary:     Effort: Pulmonary effort is normal. No respiratory distress.     Breath sounds: Normal breath sounds. No wheezing or rales.     Comments: Lungs are clear, no rales or wheezing Abdominal:     General: Bowel sounds are normal. There is no distension.     Palpations: Abdomen is soft. There is no mass.     Tenderness: There is no abdominal tenderness.  Musculoskeletal:        General: No tenderness. Normal range of motion.     Cervical back: Normal range of motion and neck supple.     Right lower leg: Edema present.     Left lower leg: Edema present.     Comments: 1+ bilateral lower extremity edema at the ankles, symmetrical  Lymphadenopathy:     Cervical: No cervical adenopathy.  Skin:    General: Skin is warm and dry.     Findings: No erythema or rash.  Neurological:     General: No focal deficit present.     Mental Status: He is alert.     Coordination: Coordination normal.  Psychiatric:        Mood and Affect: Mood and affect normal.        Behavior: Behavior normal.     ED Results / Procedures / Treatments   Labs (all labs ordered are listed, but only abnormal results are displayed) Labs Reviewed  BASIC METABOLIC PANEL - Abnormal; Notable for the following components:      Result Value   Potassium 2.8 (*)    Chloride 97 (*)    Glucose, Bld 164 (*)    All other  components within normal limits  CBC - Abnormal; Notable for the following components:   WBC 13.3 (*)    Hemoglobin 9.3 (*)    HCT 34.0 (*)    MCV 67.2 (*)    MCH 18.4 (*)    MCHC 27.4 (*)    RDW 18.3 (*)    Platelets 585 (*)    All other components within normal limits  TROPONIN I (HIGH SENSITIVITY)  TROPONIN I (HIGH SENSITIVITY)    EKG EKG Interpretation  Date/Time:  Sunday December 24 2020 16:52:16 EST Ventricular Rate:  105 PR Interval:    QRS Duration: 130 QT Interval:  380 QTC Calculation: 502 R Axis:   52 Text Interpretation: Atrial fibrillation with rapid ventricular response Right bundle branch block Abnormal ECG Since last tracing rate faster Confirmed by Eber Hong ([id number]) on 12/24/2020 5:30:02 PM   Radiology DG Chest 2 View  Result Date: 12/24/2020 CLINICAL DATA:  Chest pain and shortness of breath. Coronary artery disease. EXAM: CHEST - 2 VIEW COMPARISON:  04/07/2019 FINDINGS: The heart size and mediastinal contours are within normal limits. Coronary artery stent again noted. Both lungs are clear. The visualized skeletal structures are unremarkable. IMPRESSION: No active cardiopulmonary disease. Electronically Signed   By: Danae Orleans M.D.   On: 12/24/2020 17:17    Procedures .Critical Care Performed by: Eber Hong, MD Authorized by: Eber Hong, MD   Critical care provider statement:    Critical care time (minutes):  35   Critical care time was exclusive of:  Separately billable procedures and treating other patients and teaching time   Critical care was necessary to treat or prevent imminent or life-threatening deterioration of the following conditions:  Cardiac failure  Critical care was time spent personally by me on the following activities:  Blood draw for specimens, development of treatment plan with patient or surrogate, discussions with consultants, evaluation of patient's response to treatment, examination of patient, obtaining history from  patient or surrogate, ordering and performing treatments and interventions, ordering and review of laboratory studies, ordering and review of radiographic studies, pulse oximetry, re-evaluation of patient's condition and review of old charts Comments:           Medications Ordered in ED Medications  potassium chloride 10 mEq in 100 mL IVPB (10 mEq Intravenous New Bag/Given 12/24/20 1825)  magnesium sulfate IVPB 2 g 50 mL (2 g Intravenous New Bag/Given 12/24/20 1827)  diltiazem (CARDIZEM) injection 20 mg (has no administration in time range)  nitroGLYCERIN (NITROGLYN) 2 % ointment 1 inch (has no administration in time range)  aspirin chewable tablet 324 mg (324 mg Oral Given 12/24/20 1805)    ED Course  I have reviewed the triage vital signs and the nursing notes.  Pertinent labs & imaging results that were available during my care of the patient were reviewed by me and considered in my medical decision making (see chart for details).    MDM Rules/Calculators/A&P                          EKG shows that he does have a history of what appears to be a right bundle branch block with atrial fibrillation, no significant changes other than the right.  Chest x-ray does not reveal any signs of infiltrate or obvious pneumothorax, pulmonary edema or abnormal mediastinum.  Labs are pending at this time.  The patient likely needs a little bit of rate control, this may be recurrent coronary disease as he states his symptoms are similar but much worse than they were couple years ago when he presented.  Anticipate admission to the hospital for troponin trending and potential cardiology evaluation if troponins are elevated.  Thankfully troponin is unremarkable, the patient has persistent A. fib with heart rates up to 140, given diltiazem.  Also given nitroglycerin paste as the patient has a negative troponin I have chosen not anticoagulated with heparin given his GI history.  Will be admitted to the  hospitalist, I appreciate them willing to take care of this patient likely has an acute coronary syndrome with unstable angina  Final Clinical Impression(s) / ED Diagnoses Final diagnoses:  Unstable angina [facility])    Rx / DC Orders ED Discharge Orders    None       Eber Hong, MD 12/24/20 [phone]

## 2020-12-24 NOTE — ED Notes (Signed)
Awaiting RN to return call for report

## 2020-12-24 NOTE — Consult Note (Signed)
CONSULTATION NOTE   Patient Name: Allen Terry Date of Encounter: 12/24/2020 Cardiologist: No primary care provider on file.  Chief Complaint   Chest pain and shortness of breath  Impression   Unstable angina Extensive CAD history s/p DES to mLAD (12/2018), residual 1st diagonal dx (70%), prior DES to RCA 2012, LAD 2015, 2nd OM 2017 Paroxysmal Afib RVR (not on anticoagulation) H/o GI bleed (gastric AVMs) Iron deficiency anemia H/o ?medication non adherence BPH  Recommendation  - Patient does have CAD and might have progression of CAD/ISR of stents, his chest pain is concerning and suggestive of unstable angina thus heparin was initiated. However, he has not leaked any troponins and no ischemic EKGs (he has Afib with RVR and some mild ST depressions but very subtle). He has not taken SL nitro at home at all. Moreover, he has non compliance history- not seen a cardiologist since 2020 and h/o GIB- not tolerating DAPT, not tolerating statins  - I had extensive discussion with the patient. I would want him to get a STRESS TEST to figure out the degree of ischemia he has rather than direct CATH, however, he straightout states 'No stress test. If you want to do cath then do it or else medical therapy is fine'.   - for now, continue aspirin , diltiazem  daily. Would also put him on metoprolol xl  daily. States he is intolerant to statin (could try atleast pravastatin  and zetia  daily). Also put him on Imdur  daily  - continue heparin gtt for now.  - continue trend trops/ekg   - ideally, I would push for a stress test given negative trops and get an idea of ischemic burden, but patient is refusing.   - keep him NPO after MN. Will discuss with am team about CATH/LHC. - get ECHO in am - continuous telemetry  - he should be referred for watchman procedure for Afib.   Patient Profile   Allen Terry is a 85 y/o male with PMH significant CAD status post DES to the RCA  in 2012, DES to the LAD in 2015, and DES to the second obtuse marginal in 2017, s/p DES to mid LAD in 12/2018, residual 1st diagonal dx (70%), HTN< HLD, h/o GI bleed, Afib- not on anticoagulation due to GIB presented to the ER with c/o chest pain and shortness of breath.  HPI   Allen Terry is a 85 y.o. male who is being seen today for the evaluation of unstable angina at the request of Dr Benita Gutter (IM attending).  The patient was last seen by Cardiology in 12/2018 and has not followed up since then. He remained on DAPT for 3 months afterwards Allen Piechotaas taken off antiplatelet and kept only on aspirin secondary to GI bleed.   Apparently was at [facility] on 09/13/20- for chest pain/angina and DOE. ordered PET stress however unable to do it because of neck/back pain. Given h/o GI bleeding and not able to tolerate- LHC was not performed and was discharged on medication- rosuvastatin ,  Diltiazem , aspirin , hctz , protonix.  Today, the patient presenting with 2 weeks of waxing/waning chest pain with radiation to the left shoulder and back both, mostly during exertion, associated with SOB. He has never taken SL nitro in the last 2 weeks for chest pain but does report he takes when he needs them.  He also had some LE swelling for which he took lasix PRN. Denies orthopnea, pnd, dizziness, syncope,  recent GIB. He reports compliant with meds but is intolerant to many medications including brilinta, statins.  ED Course: He was noted to be in atrial fibrillation with RVR with rates up to 140 which improved following IV 20 mg of diltiazem. Currently still has dull chest pain with nitroglycerin patch.  CBC shows mild leukocytosis of 13.3, stable anemia of 9.3, hypokalemia 2.8, mild hyperglycemia 164.  No IV heparin was initially initiated by ED physician due to his history of GI bleed. However, when the hospitalist  called concerning for unstable angina- heparin was initiated.    Prior Cardiac  work up: history of CAD status post DES to the RCA in 2012, DES to the LAD in 2015, and DES to the second obtuse marginal in 2017.He presented with unstable angina on 12/2018- and underwent DES to mid LAD.  Cardiac catheterization 05/03/2019:  Non-stenotic Prox LAD lesion was previously treated.  Previously placed Ost 2nd Mrg drug eluting stent is widely patent.  Patent RCA stent.  Prox LAD to Mid LAD lesion is 80% stenosed.  A drug-eluting stent was successfully placed using a STENT SYNERGY DES 2.5X20.  Post intervention, there is a 0% residual stenosis.  The left ventricular systolic function is normal.  LV end diastolic pressure is normal. LVEDP 13 mm Hg.  The left ventricular ejection fraction is 55-65% by visual estimate.  There is no aortic valve stenosis.  Ost 1st Diag lesion is 70% stenosed. Progression of LAD and diagonal disease compared to prior.    LAD treated.  Medical therapy for diagonal.  Consider clopidogrel monotherapy after 1 year.  Effient not used due to his age.  He was unclear why this was used in the past.   ECHO 2020: n/a, EF 55-60%   From chart review: He had admission in June 2020 with a similar presentation, found to have a gastric AVM that was treated [facility]. He had a normal colonoscopy at that time.  Admitted: 3/1-3/19 for acute GIB and Afib RVr- not anticoagulated, EGD- unremarkable, sent home on diltiazem and s/p iron infusions    ECG   EKG: RBBB, afib RVR, there is some mild ST depression in the lateral leads (anterolateral leads) Troponin negative  Telemetry   on - Personally Reviewed  Radiology   DG Chest 2 View  Result Date: 12/24/2020 CLINICAL DATA:  Chest pain and shortness of breath. Coronary artery disease. EXAM: CHEST - 2 VIEW COMPARISON:  04/07/2019 FINDINGS: The heart size and mediastinal contours are within normal limits. Coronary artery stent again noted. Both lungs are clear. The visualized skeletal structures are  unremarkable. IMPRESSION: No active cardiopulmonary disease. Electronically Signed   By: Danae Orleans M.D.   On: 12/24/2020 17:17    Cardiac Studies   As noted above  PMHx   Past Medical History:  Diagnosis Date  . Acute lower GI bleeding 2015   "related to blood thinners"  . Anemia   . Bursitis of both shoulders   . CAD (coronary artery disease)    a. s/p PCI 01/17/2011 85% proximal RCA with DES. b. PCI 09/05/2014 60% mid LAD lesion, with DES  b. Cath 02/13/2016 80% ost OM2 treated with 2.7514 mm Resolute DES. d. Cath 12/2018 s/p DES to prox-mid LAD with residual 70% diagonal disease for medical therapy, EF 55-65%.  Marland Kitchen HTN (hypertension)    "not anymore" (02/12/2016)  . Hyperlipidemia   . Noncompliance   . Pneumonia    "double"  . Pre-diabetes   . Tuberculosis    "  I've got 25% somewhere in my body" (02/12/2016)    Past Surgical History:  Procedure Laterality Date  . CARDIAC CATHETERIZATION N/A 02/13/2016   Procedure: Left Heart Cath and Coronary Angiography;  Surgeon: Lennette Bihari, MD;  Location: [facility] INVASIVE CV LAB;  Service: Cardiovascular;  Laterality: N/A;  . CARDIAC CATHETERIZATION N/A 02/13/2016   Procedure: Coronary Stent Intervention;  Surgeon: Lennette Bihari, MD;  Location: [facility] INVASIVE CV LAB;  Service: Cardiovascular;  Laterality: N/A;  . CATARACT EXTRACTION W/ INTRAOCULAR LENS  IMPLANT, BILATERAL Bilateral   . CORONARY ANGIOPLASTY WITH STENT PLACEMENT     s/p PCI 01/17/2011 85% proximal RCA with DES, PCI 09/05/2014 60% mid LAD lesion, with DES   . CORONARY STENT INTERVENTION N/A 01/01/2019   Procedure: CORONARY STENT INTERVENTION;  Surgeon: Corky Crafts, MD;  Location: [facility] INVASIVE CV LAB;  Service: Cardiovascular;  Laterality: N/A;  . LEFT HEART CATH AND CORONARY ANGIOGRAPHY N/A 01/01/2019   Procedure: LEFT HEART CATH AND CORONARY ANGIOGRAPHY;  Surgeon: Corky Crafts, MD;  Location: [facility] INVASIVE CV LAB;  Service: Cardiovascular;  Laterality: N/A;  . TONSILLECTOMY     . UPPER GI ENDOSCOPY     s/p GI bleed related to Brilenta    FAMHx   Family History  Problem Relation Age of Onset  . Arrhythmia Mother   . Arrhythmia Sister   . Stroke Father   . Hypertension Brother     SOCHx    reports that he has quit smoking. His smoking use included cigarettes. He has never used smokeless tobacco. He reports current alcohol use. He reports that he does not use drugs.  Outpatient Medications   No current facility-administered medications on file prior to encounter.   Current Outpatient Medications on File Prior to Encounter  Medication Sig Dispense Refill  . aspirin EC 81 MG tablet Take 81 mg by mouth daily.     . carvedilol (COREG) 3.125 MG tablet Take 1 tablet (3.125 mg total) by mouth 2 (two) times daily with a meal. 60 tablet 5  . diltiazem (DILACOR XR) 120 MG 24 hr capsule Take 120 mg by mouth daily.    . finasteride (PROSCAR) 5 MG tablet Take 5 mg by mouth daily.    . pantoprazole (PROTONIX) 40 MG tablet Take 40 mg by mouth daily.    . tamsulosin (FLOMAX) 0.4 MG CAPS capsule Take 0.8 capsules by mouth daily.    . nitroGLYCERIN (NITROSTAT) 0.4 MG SL tablet Place 1 tablet (0.4 mg total) under the tongue every 5 (five) minutes x 3 doses as needed for chest pain. 25 tablet 3    Inpatient Medications    Scheduled Meds: . [START ON 12/25/2020] aspirin EC  81 mg Oral Daily  . diltiazem  120 mg Oral Daily  . pantoprazole  40 mg Oral Daily    Continuous Infusions: . heparin      PRN Meds: acetaminophen, ondansetron (ZOFRAN) IV   ALLERGIES   Allergies  Allergen Reactions  . Ferrous Sulfate Other (See Comments)    Face burning  . Meclizine Other (See Comments)    Burning in face when taking iron  . Metoprolol Other (See Comments)    abd pain  . Pravastatin Other (See Comments)    Myalgias, even when combined with coenzyme q 10  . Ticagrelor Other (See Comments)    Cough, sneezing and nasal congestion  . Lisinopril Cough  . Penicillins  Hives and Rash    Did it involve swelling of the  face/tongue/throat, SOB, or low BP? No Did it involve sudden or severe rash/hives, skin peeling, or any reaction on the inside of your mouth or nose? No Did you need to seek medical attention at a hospital or doctor's office? Yes When did it last happen?40 years If all above answers are "NO", may proceed with cephalosporin use.    ROS   Pertinent items noted in HPI and remainder of comprehensive ROS otherwise negative.  Vitals   Vitals:   12/24/20 1815 12/24/20 1903 12/24/20 2030 12/24/20 2100  BP: 118/75 125/81  98/68  Pulse: (!) 106 (!) 107 94 82  Resp: (!) 21 (!) 22 19 17   Temp:      TempSrc:      SpO2: 100% 98% 99% 99%    Intake/Output Summary (Last 24 hours) at 12/24/2020 2126 Last data filed at 12/24/2020 1942 Gross per 24 hour  Intake 150 ml  Output --  Net 150 ml   There were no vitals filed for this visit.  Physical Exam   HEENT: Normocephalic and atraumatic.  Neck: Supple. No carotid bruits. No JVD. Heart: irregularly irregular, normal S1 and S2.no murmurs No gallops or rubs. Radial and distal pedal pulses 2+ and equal bilaterally. Lungs: clear to auscultation b/l, no crackles, wheeze Abdomen: Soft, non-distended, and non-tender to palpation. Bowel sounds present. Extremities: 2+ lower extremity edema.    Skin: Warm and dry. Neuro: Alert,oriented x3, No focal deficits. Psych: Normal affect. Responds appropriately.  Labs   Results for orders placed or performed during the hospital encounter of 12/24/20 (from the past 48 hour(s))  Basic metabolic panel     Status: Abnormal   Collection Time: 12/24/20  4:52 PM  Result Value Ref Range   Sodium 135 135 - 145 mmol/L   Potassium 2.8 (L) 3.5 - 5.1 mmol/L   Chloride 97 (L) 98 - 111 mmol/L   CO2 25 22 - 32 mmol/L   Glucose, Bld 164 (H) 70 - 99 mg/dL    Comment: Glucose reference range applies only to samples taken after fasting for at least 8 hours.   BUN 8  8 - 23 mg/dL   Creatinine, Ser 1.611.05 0.61 - 1.24 mg/dL   Calcium 9.0 8.9 - 09.610.3 mg/dL   GFR, Estimated >04>60 >54>60 mL/min    Comment: (NOTE) Calculated using the CKD-EPI Creatinine Equation (2021)    Anion gap 13 5 - 15    Comment: Performed at [facility], [address] [place], [place] [address]  CBC     Status: Abnormal   Collection Time: 12/24/20  4:52 PM  Result Value Ref Range   WBC 13.3 (H) 4.0 - 10.5 K/uL   RBC 5.06 4.22 - 5.81 MIL/uL   Hemoglobin 9.3 (L) 13.0 - 17.0 g/dL   HCT 91.434.0 (L) 78.239.0 - 95.652.0 %   MCV 67.2 (L) 80.0 - 100.0 fL   MCH 18.4 (L) 26.0 - 34.0 pg   MCHC 27.4 (L) 30.0 - 36.0 g/dL   RDW 21.318.3 (H) 08.611.5 - 57.815.5 %   Platelets 585 (H) 150 - 400 K/uL    Comment: REPEATED TO VERIFY   nRBC 0.0 0.0 - 0.2 %    Comment: Performed at [facility], [address] [place], [place] [address]  Troponin I (High Sensitivity)     Status: None   Collection Time: 12/24/20  4:52 PM  Result Value Ref Range   Troponin I (High Sensitivity) 8 <18 ng/L    Comment: (  NOTE) Elevated high sensitivity troponin I (hsTnI) values and significant  changes across serial measurements may suggest ACS but many other  chronic and acute conditions are known to elevate hsTnI results.  Refer to the "Links" section for chest pain algorithms and additional  guidance. Performed at [facility], [address], [place], [place] [address]   Troponin I (High Sensitivity)     Status: None   Collection Time: 12/24/20  8:05 PM  Result Value Ref Range   Troponin I (High Sensitivity) 8 <18 ng/L    Comment: (NOTE) Elevated high sensitivity troponin I (hsTnI) values and significant  changes across serial measurements may suggest ACS but many other  chronic and acute conditions are known to elevate hsTnI results.  Refer to the "Links" section for chest pain algorithms and additional  guidance. Performed at [facility], [address], [place], [place] [address]         Length  of Stay:  LOS: 0 days      Elmon Kirschner 12/24/2020, 9:26 PM

## 2020-12-24 NOTE — ED Triage Notes (Signed)
Pt c/o chest pain that radiates down his left arm and shortness of breath. States symptoms have been intermittent "for a while" and worsened today. Hx stents x 4.

## 2020-12-24 NOTE — ED Notes (Signed)
Patient argumentative over being placed on the monitor. Patient educated of the importance of frequent VS monitoring based off of CC and acuity. Patient states "if it (BP cuff) gets tight I'm going to take it off".

## 2020-12-25 ENCOUNTER — Observation Stay (HOSPITAL_BASED_OUTPATIENT_CLINIC_OR_DEPARTMENT_OTHER): Payer: Medicare Other

## 2020-12-25 DIAGNOSIS — I361 Nonrheumatic tricuspid (valve) insufficiency: Secondary | ICD-10-CM

## 2020-12-25 DIAGNOSIS — I200 Unstable angina: Secondary | ICD-10-CM | POA: Diagnosis not present

## 2020-12-25 DIAGNOSIS — R079 Chest pain, unspecified: Secondary | ICD-10-CM | POA: Diagnosis not present

## 2020-12-25 DIAGNOSIS — D509 Iron deficiency anemia, unspecified: Secondary | ICD-10-CM | POA: Diagnosis not present

## 2020-12-25 DIAGNOSIS — R0789 Other chest pain: Secondary | ICD-10-CM | POA: Diagnosis not present

## 2020-12-25 DIAGNOSIS — I340 Nonrheumatic mitral (valve) insufficiency: Secondary | ICD-10-CM | POA: Diagnosis not present

## 2020-12-25 DIAGNOSIS — I4891 Unspecified atrial fibrillation: Secondary | ICD-10-CM | POA: Diagnosis not present

## 2020-12-25 DIAGNOSIS — E876 Hypokalemia: Secondary | ICD-10-CM | POA: Diagnosis not present

## 2020-12-25 LAB — CBC
HCT: 29 % — ABNORMAL LOW (ref 39.0–52.0)
Hemoglobin: 8.1 g/dL — ABNORMAL LOW (ref 13.0–17.0)
MCH: 18.5 pg — ABNORMAL LOW (ref 26.0–34.0)
MCHC: 27.9 g/dL — ABNORMAL LOW (ref 30.0–36.0)
MCV: 66.1 fL — ABNORMAL LOW (ref 80.0–100.0)
Platelets: 534 10*3/uL — ABNORMAL HIGH (ref 150–400)
RBC: 4.39 MIL/uL (ref 4.22–5.81)
RDW: 17.9 % — ABNORMAL HIGH (ref 11.5–15.5)
WBC: 13.5 10*3/uL — ABNORMAL HIGH (ref 4.0–10.5)
nRBC: 0 % (ref 0.0–0.2)

## 2020-12-25 LAB — HEMOGLOBIN A1C
Hgb A1c MFr Bld: 6.5 % — ABNORMAL HIGH (ref 4.8–5.6)
Mean Plasma Glucose: 139.85 mg/dL

## 2020-12-25 LAB — HEPARIN LEVEL (UNFRACTIONATED): Heparin Unfractionated: <0.1 IU/mL — ABNORMAL LOW (ref 0.30–0.70)

## 2020-12-25 LAB — ECHOCARDIOGRAM COMPLETE
Area-P 1/2: 3.21 cm2
Height: 68 in
S' Lateral: 3.2 cm
Weight: 3104 oz

## 2020-12-25 LAB — BASIC METABOLIC PANEL
Anion gap: 11 (ref 5–15)
BUN: 11 mg/dL (ref 8–23)
CO2: 26 mmol/L (ref 22–32)
Calcium: 8.5 mg/dL — ABNORMAL LOW (ref 8.9–10.3)
Chloride: 98 mmol/L (ref 98–111)
Creatinine, Ser: 1.13 mg/dL (ref 0.61–1.24)
GFR, Estimated: >60 mL/min (ref >60)
Glucose, Bld: 119 mg/dL — ABNORMAL HIGH (ref 70–99)
Potassium: 2.9 mmol/L — ABNORMAL LOW (ref 3.5–5.1)
Sodium: 135 mmol/L (ref 135–145)

## 2020-12-25 LAB — IRON AND TIBC
Iron: 14 ug/dL — ABNORMAL LOW (ref 45–182)
Saturation Ratios: 3 % — ABNORMAL LOW (ref 17.9–39.5)
TIBC: 490 ug/dL — ABNORMAL HIGH (ref 250–450)
UIBC: 476 ug/dL

## 2020-12-25 LAB — TROPONIN I (HIGH SENSITIVITY): Troponin I (High Sensitivity): 7 ng/L (ref <18)

## 2020-12-25 LAB — SARS CORONAVIRUS 2 (TAT 6-24 HRS): SARS Coronavirus 2: NEGATIVE

## 2020-12-25 MED ORDER — POTASSIUM CHLORIDE CRYS ER 20 MEQ PO TBCR
40.0000 meq | EXTENDED_RELEASE_TABLET | Freq: Three times a day (TID) | ORAL | Status: AC
  Administered 2020-12-25 (×3): 40 meq via ORAL
  Filled 2020-12-25 (×3): qty 2

## 2020-12-25 MED ORDER — CARVEDILOL 3.125 MG PO TABS: 3.1250 mg | ORAL_TABLET | Freq: Two times a day (BID) | ORAL | Status: DC

## 2020-12-25 MED ORDER — HYDROCORTISONE 1 % EX OINT
TOPICAL_OINTMENT | CUTANEOUS | Status: DC | PRN
  Filled 2020-12-25: qty 28

## 2020-12-25 MED ORDER — HYDROMORPHONE HCL 1 MG/ML IJ SOLN
0.5000 mg | INTRAMUSCULAR | Status: DC | PRN
  Administered 2020-12-25: 0.5 mg via INTRAVENOUS
  Filled 2020-12-25: qty 1

## 2020-12-25 MED ORDER — HYDROCODONE-ACETAMINOPHEN 5-325 MG PO TABS
1.0000 | ORAL_TABLET | ORAL | Status: DC | PRN
  Administered 2020-12-25 – 2020-12-26 (×5): 1 via ORAL
  Filled 2020-12-25 (×5): qty 1

## 2020-12-25 MED ORDER — CARVEDILOL 6.25 MG PO TABS
6.2500 mg | ORAL_TABLET | Freq: Two times a day (BID) | ORAL | Status: DC
  Administered 2020-12-25 – 2020-12-26 (×2): 6.25 mg via ORAL
  Filled 2020-12-25 (×3): qty 1

## 2020-12-25 NOTE — Progress Notes (Signed)
  Echocardiogram 2D Echocardiogram has been performed.  Tiffany G Dance 12/25/2020, 9:30 AM

## 2020-12-25 NOTE — Progress Notes (Signed)
Cardiology Progress Note  Patient ID: Allen Terry MRN: [id number] DOB: 11/30/1935 Date of Encounter: 12/25/2020  Primary Cardiologist: No primary care provider on file.  Subjective   Chief Complaint: None.  HPI: Admitted with chest pain.  Troponins are negative.  EKG with A. fib.  History of GI bleeding.  Hemoglobin has dropped one-point on heparin drip.  He underwent PCI in March 2020.  No longer on antiplatelet agents given history of chronic GI bleed 9 deficiency anemia.  Also not anticoagulation for his paroxysmal atrial fibrillation due to GI bleed.  He reports no further chest pain.  Resting comfortably.  ROS:  All other ROS reviewed and negative. Pertinent positives noted in the HPI.     Inpatient Medications  Scheduled Meds: . aspirin EC  81 mg Oral Daily  . carvedilol  3.125 mg Oral BID WC  . diltiazem  120 mg Oral Daily  . isosorbide mononitrate  30 mg Oral Daily  . pantoprazole  40 mg Oral Daily  . potassium chloride  40 mEq Oral TID   Continuous Infusions:  PRN Meds: acetaminophen, hydrocortisone, HYDROmorphone (DILAUDID) injection, ondansetron (ZOFRAN) IV   Vital Signs   Vitals:   12/24/20 2338 12/25/20 0121 12/25/20 0459 12/25/20 0756  BP: (!) 106/55 114/76 (!) 108/50 133/71  Pulse: 95 99 95 87  Resp: 20 16 18 18   Temp: 98.7 F (37.1 C) 98 F (36.7 C) 98 F (36.7 C) 98 F (36.7 C)  TempSrc:  Oral Oral Oral  SpO2: 99% 98% 99% 99%  Weight:      Height:        Intake/Output Summary (Last 24 hours) at 12/25/2020 0812 Last data filed at 12/25/2020 0600 Gross per 24 hour  Intake 812.57 ml  Output 500 ml  Net 312.57 ml   Last 3 Weights 12/24/2020 04/07/2019 04/03/2019  Weight (lbs) 194 lb 191 lb 9.8 oz 185 lb  Weight (kg) 87.998 kg 86.915 kg 83.915 kg      Telemetry  Overnight telemetry shows atrial fibrillation with heart rate in the 90s, which I personally reviewed.   ECG  The most recent ECG shows atrial fibrillation, right bundle branch block,  which I personally reviewed.   Physical Exam   Vitals:   12/24/20 2338 12/25/20 0121 12/25/20 0459 12/25/20 0756  BP: (!) 106/55 114/76 (!) 108/50 133/71  Pulse: 95 99 95 87  Resp: 20 16 18 18   Temp: 98.7 F (37.1 C) 98 F (36.7 C) 98 F (36.7 C) 98 F (36.7 C)  TempSrc:  Oral Oral Oral  SpO2: 99% 98% 99% 99%  Weight:      Height:         Intake/Output Summary (Last 24 hours) at 12/25/2020 Last data filed at 12/25/2020 0600 Gross per 24 hour  Intake 812.57 ml  Output 500 ml  Net 312.57 ml    Last 3 Weights 12/24/2020 04/07/2019 04/03/2019  Weight (lbs) 194 lb 191 lb 9.8 oz 185 lb  Weight (kg) 87.998 kg 86.915 kg 83.915 kg    Body mass index is 29.5 kg/m.   General: Well nourished, well developed, in no acute distress Head: Atraumatic, normal size  Eyes: PEERLA, EOMI  Neck: Supple, no JVD Endocrine: No thryomegaly Cardiac: Normal S1, S2; irregular rhythm, no murmurs rubs or gallops Lungs: Clear to auscultation bilaterally, no wheezing, rhonchi or rales  Abd: Soft, nontender, no hepatomegaly  Ext: No edema, pulses 2+ Musculoskeletal: No deformities, BUE and BLE strength normal and equal Skin:  Warm and dry, no rashes   Neuro: Alert and oriented to person, place, time, and situation, CNII-XII grossly intact, no focal deficits  Psych: Normal mood and affect   Labs  High Sensitivity Troponin:   Recent Labs  Lab 12/24/20 1652 12/24/20 2005 12/25/20 0438  TROPONINIHS 8 8 7      Cardiac EnzymesNo results for input(s): TROPONINI in the last 168 hours. No results for input(s): TROPIPOC in the last 168 hours.  Chemistry Recent Labs  Lab 12/24/20 1652 12/25/20 0438  NA 135 135  K 2.8* 2.9*  CL 97* 98  CO2 25 26  GLUCOSE 164* 119*  BUN 8 11  CREATININE 1.05 1.13  CALCIUM 9.0 8.5*  GFRNONAA >60 >60  ANIONGAP 13 11    Hematology Recent Labs  Lab 12/24/20 1652 12/25/20 0438  WBC 13.3* 13.5*  RBC 5.06 4.39  HGB 9.3* 8.1*  HCT 34.0* 29.0*  MCV 67.2* 66.1*   MCH 18.4* 18.5*  MCHC 27.4* 27.9*  RDW 18.3* 17.9*  PLT 585* 534*   BNPNo results for input(s): BNP, PROBNP in the last 168 hours.  DDimer No results for input(s): DDIMER in the last 168 hours.   Radiology  DG Chest 2 View  Result Date: 12/24/2020 CLINICAL DATA:  Chest pain and shortness of breath. Coronary artery disease. EXAM: CHEST - 2 VIEW COMPARISON:  04/07/2019 FINDINGS: The heart size and mediastinal contours are within normal limits. Coronary artery stent again noted. Both lungs are clear. The visualized skeletal structures are unremarkable. IMPRESSION: No active cardiopulmonary disease. Electronically Signed   By: 06/07/2019 M.D.   On: 12/24/2020 17:17    Cardiac Studies  LHC 01/01/2019   Non-stenotic Prox LAD lesion was previously treated.  Previously placed Ost 2nd Mrg drug eluting stent is widely patent.  Patent RCA stent.  Prox LAD to Mid LAD lesion is 80% stenosed.  A drug-eluting stent was successfully placed using a STENT SYNERGY DES 2.5X20.  Post intervention, there is a 0% residual stenosis.  The left ventricular systolic function is normal.  LV end diastolic pressure is normal. LVEDP 13 mm Hg.  The left ventricular ejection fraction is 55-65% by visual estimate.  There is no aortic valve stenosis.  Ost 1st Diag lesion is 70% stenosed.   Progression of LAD and diagonal disease compared to prior.    LAD treated.  Medical therapy for diagonal.  Consider clopidogrel monotherapy after 1 year.  Effient not used due to his age.  He was unclear why this was used in the past.  Possible discharge later today.    Patient Profile  Allen Terry is a 85 y.o. male with paroxysmal atrial fibrillation not on anticoagulation due to GI bleeding, CAD status post PCI in the past, iron deficiency anemia secondary to possible gastric AVMs, medication noncompliance who was admitted on 12/24/2020 with chest pain concerning for unstable angina.  Assessment & Plan    1.  Chest pain -He presents with 2 weeks of intermittent chest pain that occurs at rest and with exertion.  EKG shows he is back in atrial fibrillation with right bundle branch block but no acute ischemic changes.  Troponins are negative x3. -Last intervention was in March 2020.  Underwent PCI to the mid LAD.  Had residual disease in the small diagonal branch that was recommended to be treated medically. -He has a long history of chronic iron deficiency anemia without really treatable source.  He has gastric AVMs and capsule endoscopy was recommended.  He has not followed through with this.  He is not been on any blood thinners due to this reason. -He was admitted overnight and given aspirin and heparin.  Hemoglobin has dropped from 9.3-8.1.  No obvious source of bleeding.  He is very iron deficient.  He is not a candidate for heart catheterization at this time.  Continue to trend hemoglobin.  Hold heparin due to drop in hemoglobin.  We can see how he does on aspirin.  If hemoglobin continues to drop this will solidify my decision that he is not a heart catheterization candidate. -We discussed stress testing to restratify him but he is not interested.  He reports he is adamant that there will be no stress testing done. -We will obtain an echocardiogram today.  We can restratify him based on that. -His symptoms may just be due to A. Fib.  2.  Persistent atrial fibrillation -Not anticoagulation at home.  Not a candidate for anticoagulation given GI bleed.  Hemoglobin is dropped.  Hold heparin. -Continue with rate control with Coreg and diltiazem.  For questions or updates, please contact [facility] Please consult [email or web address] for contact info under   Time Spent with Patient: I have spent a total of 25 minutes with patient reviewing hospital notes, telemetry, EKGs, labs and examining the patient as well as establishing an assessment and plan that was discussed with the patient.  > 50% of time  was spent in direct patient care.    Signed, Lenna Gilford. Flora Lipps, MD [facility]  12/25/2020 8:12 AM

## 2020-12-25 NOTE — Progress Notes (Signed)
Patient requesting medication for rash, states pain medicine gave me a rash.  No rash noted on assessment, area around neck red from patient scratching.  Lotion at bed side applied to neck and back per patient request.  Cortisone cream per standing prn orders ordered and will apply to affected areas.  Will continue to monitor patient closely.

## 2020-12-25 NOTE — Progress Notes (Signed)
Patient called to report the pain medication was making him jump all over and legs cramping.  Patient stated he does this at home but not this severe.  VSS, MD notified with new orders received.  Will continue to monitor closely.

## 2020-12-25 NOTE — Progress Notes (Signed)
[facility] PROGRESS NOTE    Progress Note  Allen Terry  BJY:[id number] DOB: 1935-12-20 DOA: 12/24/2020 PCP: Pcp, No     Brief Narrative:   Allen Terry is an 85 y.o. male past medical history significant for CAD status post DES x4, atrial fibrillation on anticoagulation due to GI bleed, gastric AVM and intolerance to dual antiplatelet therapy, essential hypertension iron deficiency anemia comes into the hospital for chest pain last cath was on 3/20 with a patent second marginal and a right ICA stent with restenosis of the proximal and mid LAD which were restented.  Significant studies: Chest x-ray showed no active cardiopulmonary disease  Antibiotics: None  Microbiology data: Blood culture:  Procedures: None  Assessment/Plan:   Acute coronary syndrome/ Unstable angina [facility]): Cardiology was consulted recommended to continue aspirin, diltiazem and metoprolol. Also started on Imdur, they also recommended to start him on heparin drip. Cardiac biomarkers have basically remained flat. Cardiology recommended a stress test as an inpatient. 2D echo is pending. We will probably need to consult hospice and palliative care to drive start addressing end-of-life. As it seems that his comorbidities and intolerance to medication are significantly impacted his treatment.  A. fib with RVR: We will start her on diltiazem and metoprolol. Remains in a fib. Controlled on current regimen. Not on anticoagulation due to history of GI bleed. He is currently on heparin will monitor for signs of bleeding.  Hypokalemia: Replete and recheck in the morning  Essential HTN (hypertension) Blood pressure seems to be fairly controlled continue metoprolol and diltiazem.  Pre-diabetes: Continue sliding scale insulin.  Iron deficiency anemia with a history of GI bleed/gastric AVMs: No signs of melanotic stools he is on iron supplement. Hemoglobin was 9.3 on admission now this morning is  8.1.    DVT prophylaxis: heparin Family Communication:none Status is: Observation  The patient remains OBS appropriate and will d/c before 2 midnights.  Dispo: The patient is from: Home              Anticipated d/c is to: Home              Patient currently is not medically stable to d/c.   Difficult to place patient No   Code Status:     Code Status Orders  (From admission, onward)         Start     Ordered   12/24/20 1942  Full code  Continuous        12/24/20 1941        Code Status History    Date Active Date Inactive Code Status Order ID Comments User Context   12/31/2018 1714 01/02/2019 1705 Full Code [id number]  Leone Brand, NP ED   02/12/2016 2055 02/14/2016 1344 Full Code [id number]  Arty Baumgartner, NP Inpatient   Advance Care Planning Activity        IV Access:    Peripheral IV   Procedures and diagnostic studies:   DG Chest 2 View  Result Date: 12/24/2020 CLINICAL DATA:  Chest pain and shortness of breath. Coronary artery disease. EXAM: CHEST - 2 VIEW COMPARISON:  04/07/2019 FINDINGS: The heart size and mediastinal contours are within normal limits. Coronary artery stent again noted. Both lungs are clear. The visualized skeletal structures are unremarkable. IMPRESSION: No active cardiopulmonary disease. Electronically Signed   By: Danae Orleans M.D.   On: 12/24/2020 17:17     Medical Consultants:    None.   Subjective:  Allen Terry denies any chest pain or shortness of breath.  Objective:    Vitals:   12/24/20 2320 12/24/20 2338 12/25/20 0121 12/25/20 0459  BP:  (!) 106/55 114/76 (!) 108/50  Pulse: (!) 59 95 99 95  Resp:  20 16 18   Temp:  98.7 F (37.1 C) 98 F (36.7 C) 98 F (36.7 C)  TempSrc:   Oral Oral  SpO2: 97% 99% 98% 99%  Weight:      Height:       SpO2: 99 %   Intake/Output Summary (Last 24 hours) at 12/25/2020 12/27/2020 Last data filed at 12/25/2020 0600 Gross per 24 hour  Intake 812.57 ml  Output 500 ml  Net  312.57 ml   Filed Weights   12/24/20 2156  Weight: 88 kg    Exam: General exam: In no acute distress. Respiratory system: Good air movement and clear to auscultation. Cardiovascular system: S1 & S2 heard, RRR. No JVD. Gastrointestinal system: Abdomen is nondistended, soft and nontender.  Extremities: No pedal edema. Skin: No rashes, lesions or ulcers Psychiatry: Judgement and insight appear normal. Mood & affect appropriate.    Data Reviewed:    Labs: Basic Metabolic Panel: Recent Labs  Lab 12/24/20 1652  NA 135  K 2.8*  CL 97*  CO2 25  GLUCOSE 164*  BUN 8  CREATININE 1.05  CALCIUM 9.0   GFR Estimated Creatinine Clearance: 58.5 mL/min (by C-G formula based on SCr of 1.05 mg/dL). Liver Function Tests: No results for input(s): AST, ALT, ALKPHOS, BILITOT, PROT, ALBUMIN in the last 168 hours. No results for input(s): LIPASE, AMYLASE in the last 168 hours. No results for input(s): AMMONIA in the last 168 hours. Coagulation profile No results for input(s): INR, PROTIME in the last 168 hours. COVID-19 Labs  No results for input(s): DDIMER, FERRITIN, LDH, CRP in the last 72 hours.  Lab Results  Component Value Date   SARSCOV2NAA NEGATIVE 12/24/2020   SARSCOV2NAA NEGATIVE 04/07/2019    CBC: Recent Labs  Lab 12/24/20 1652 12/25/20 0438  WBC 13.3* 13.5*  HGB 9.3* 8.1*  HCT 34.0* 29.0*  MCV 67.2* 66.1*  PLT 585* 534*   Cardiac Enzymes: No results for input(s): CKTOTAL, CKMB, CKMBINDEX, TROPONINI in the last 168 hours. BNP (last 3 results) No results for input(s): PROBNP in the last 8760 hours. CBG: No results for input(s): GLUCAP in the last 168 hours. D-Dimer: No results for input(s): DDIMER in the last 72 hours. Hgb A1c: Recent Labs    12/24/20 1652 12/25/20 0438  HGBA1C 6.5* 6.5*   Lipid Profile: Recent Labs    12/24/20 2005  CHOL 141  HDL 22*  LDLCALC 83  TRIG 12/26/20*  CHOLHDL 6.4   Thyroid function studies: Recent Labs    12/24/20 2255   TSH 2.766   Anemia work up: Recent Labs    12/25/20 0438  TIBC 490*  IRON 14*   Sepsis Labs: Recent Labs  Lab 12/24/20 1652 12/25/20 0438  WBC 13.3* 13.5*   Microbiology Recent Results (from the past 240 hour(s))  SARS CORONAVIRUS 2 (TAT 6-24 HRS) Nasopharyngeal Nasopharyngeal Swab     Status: None   Collection Time: 12/24/20  9:01 PM   Specimen: Nasopharyngeal Swab  Result Value Ref Range Status   SARS Coronavirus 2 NEGATIVE NEGATIVE Final    Comment: (NOTE) SARS-CoV-2 target nucleic acids are NOT DETECTED.  The SARS-CoV-2 RNA is generally detectable in upper and lower respiratory specimens during the acute phase of infection. Negative results  do not preclude SARS-CoV-2 infection, do not rule out co-infections with other pathogens, and should not be used as the sole basis for treatment or other patient management decisions. Negative results must be combined with clinical observations, patient history, and epidemiological information. The expected result is Negative.  Fact Sheet for Patients: HairSlick.no  Fact Sheet for Healthcare Providers: quierodirigir.com  This test is not yet approved or cleared by the [place] FDA and  has been authorized for detection and/or diagnosis of SARS-CoV-2 by FDA under an Emergency Use Authorization (EUA). This EUA will remain  in effect (meaning this test can be used) for the duration of the COVID-19 declaration under Se ction 564(b)(1) of the Act, 21 U.S.C. section 360bbb-3(b)(1), unless the authorization is terminated or revoked sooner.  Performed at [facility], [address], [place], [place] [address]      Medications:   . aspirin EC  81 mg Oral Daily  . carvedilol  3.125 mg Oral BID WC  . diltiazem  120 mg Oral Daily  . isosorbide mononitrate  30 mg Oral Daily  . pantoprazole  40 mg Oral Daily   Continuous Infusions: . heparin 950 Units/hr  (12/25/20 0545)      LOS: 0 days   Marinda Elk  [facility]  12/25/2020, 7:02 AM

## 2020-12-25 NOTE — Progress Notes (Signed)
ANTICOAGULATION CONSULT NOTE  Pharmacy Consult for heparin Indication: chest pain/ACS  Allergies  Allergen Reactions  . Ferrous Sulfate Other (See Comments)    Face burning  . Meclizine Other (See Comments)    Burning in face when taking iron  . Metoprolol Other (See Comments)    abd pain  . Pravastatin Other (See Comments)    Myalgias, even when combined with coenzyme q 10  . Ticagrelor Other (See Comments)    Cough, sneezing and nasal congestion  . Lisinopril Cough  . Penicillins Hives and Rash    Did it involve swelling of the face/tongue/throat, SOB, or low BP? No Did it involve sudden or severe rash/hives, skin peeling, or any reaction on the inside of your mouth or nose? No Did you need to seek medical attention at a hospital or doctor's office? Yes When did it last happen?40 years If all above answers are "NO", may proceed with cephalosporin use.    Patient Measurements: Height: 5\' 8"  (172.7 cm) Weight: 88 kg (194 lb) IBW/kg (Calculated) : 68.4 Heparin Dosing Weight: 65.9 kg  Vital Signs: Temp: 98 F (36.7 C) (02/28 0459) Temp Source: Oral (02/28 0459) BP: 108/50 (02/28 0459) Pulse Rate: 95 (02/28 0459)  Labs: Recent Labs    12/24/20 1652 12/24/20 2005 12/25/20 0438  HGB 9.3*  --   --   HCT 34.0*  --   --   PLT 585*  --   --   HEPARINUNFRC  --   --  <0.10*  CREATININE 1.05  --   --   TROPONINIHS 8 8  --     Estimated Creatinine Clearance: 58.5 mL/min (by C-G formula based on SCr of 1.05 mg/dL).   Medical History: Past Medical History:  Diagnosis Date  . Acute lower GI bleeding 2015   "related to blood thinners"  . Anemia   . Bursitis of both shoulders   . CAD (coronary artery disease)    a. s/p PCI 01/17/2011 85% proximal RCA with DES. b. PCI 09/05/2014 60% mid LAD lesion, with DES  b. Cath 02/13/2016 80% ost OM2 treated with 2.7514 mm Resolute DES. d. Cath 12/2018 s/p DES to prox-mid LAD with residual 70% diagonal disease for medical therapy,  EF 55-65%.  01/2019 HTN (hypertension)    "not anymore" (02/12/2016)  . Hyperlipidemia   . Noncompliance   . Pneumonia    "double"  . Pre-diabetes   . Tuberculosis    "I've got 25% somewhere in my body" (02/12/2016)     Medications:  Scheduled:  . aspirin EC  81 mg Oral Daily  . carvedilol  3.125 mg Oral BID WC  . diltiazem  120 mg Oral Daily  . isosorbide mononitrate  30 mg Oral Daily  . pantoprazole  40 mg Oral Daily    Assessment: Patient is a 22 yom that is being admitted for chest pain. Pharmacy has been asked to dose heparin at this time for ACS however has been asked not to bolus the patient as they have a hx of GIB and gastric AVM.   2/28 AM update:  Heparin level low No issues per RN  Goal of Therapy:  Heparin level 0.3-0.5 units/mL Monitor platelets by anticoagulation protocol: Yes   Plan:  -Inc heparin to 950 units/hr -Re-check heparin level at 1400  3/28, PharmD, BCPS Clinical Pharmacist Phone: [phone]

## 2020-12-25 NOTE — Progress Notes (Signed)
   12/24/20 2156  Vitals  Temp 98.1 F (36.7 C)  Temp Source Oral  BP 117/89  MAP (mmHg) 99  BP Location Right Arm  BP Method Automatic  Patient Position (if appropriate) Lying  Pulse Rate (!) 111  Pulse Rate Source Monitor  ECG Heart Rate (!) 114  Resp 16  Level of Consciousness  Level of Consciousness Alert  MEWS COLOR  MEWS Score Color Yellow  Oxygen Therapy  SpO2 98 %  O2 Device Room Air  Pain Assessment  Pain Scale 0-10  Pain Score 0  Height and Weight  Height 5\' 8"  (1.727 m)  Weight 88 kg  Type of Scale Used Standing  Type of Weight Actual  BSA (Calculated - sq m) 2.05 sq meters  BMI (Calculated) 29.5  Weight in (lb) to have BMI = 25 164.1  MEWS Score  MEWS Temp 0  MEWS Systolic 0  MEWS Pulse 2  MEWS RR 0  MEWS LOC 0  MEWS Score 2  Provider Notification  Provider Name/Title  Date Provider Notified 12/24/20  Time Provider Notified 2200  Notification Type Face-to-face  Notification Reason Other (Comment) (MD rounding)  Provider response At bedside  Date of Provider Response 12/24/20  Time of Provider Response 2200

## 2020-12-26 ENCOUNTER — Telehealth: Payer: Self-pay

## 2020-12-26 DIAGNOSIS — I200 Unstable angina: Secondary | ICD-10-CM | POA: Diagnosis not present

## 2020-12-26 DIAGNOSIS — R0789 Other chest pain: Secondary | ICD-10-CM

## 2020-12-26 LAB — BASIC METABOLIC PANEL
Anion gap: 9 (ref 5–15)
BUN: 10 mg/dL (ref 8–23)
CO2: 24 mmol/L (ref 22–32)
Calcium: 8.5 mg/dL — ABNORMAL LOW (ref 8.9–10.3)
Chloride: 100 mmol/L (ref 98–111)
Creatinine, Ser: 0.97 mg/dL (ref 0.61–1.24)
GFR, Estimated: >60 mL/min (ref >60)
Glucose, Bld: 129 mg/dL — ABNORMAL HIGH (ref 70–99)
Potassium: 4 mmol/L (ref 3.5–5.1)
Sodium: 133 mmol/L — ABNORMAL LOW (ref 135–145)

## 2020-12-26 LAB — CBC
HCT: 29 % — ABNORMAL LOW (ref 39.0–52.0)
Hemoglobin: 8 g/dL — ABNORMAL LOW (ref 13.0–17.0)
MCH: 18.6 pg — ABNORMAL LOW (ref 26.0–34.0)
MCHC: 27.6 g/dL — ABNORMAL LOW (ref 30.0–36.0)
MCV: 67.4 fL — ABNORMAL LOW (ref 80.0–100.0)
Platelets: 498 10*3/uL — ABNORMAL HIGH (ref 150–400)
RBC: 4.3 MIL/uL (ref 4.22–5.81)
RDW: 18 % — ABNORMAL HIGH (ref 11.5–15.5)
WBC: 11 10*3/uL — ABNORMAL HIGH (ref 4.0–10.5)
nRBC: 0 % (ref 0.0–0.2)

## 2020-12-26 LAB — MAGNESIUM: Magnesium: 2.5 mg/dL — ABNORMAL HIGH (ref 1.7–2.4)

## 2020-12-26 MED ORDER — ISOSORBIDE MONONITRATE ER 30 MG PO TB24: 30.0000 mg | ORAL_TABLET | Freq: Every day | ORAL | 0 refills | Status: AC

## 2020-12-26 MED ORDER — CARVEDILOL 6.25 MG PO TABS
6.2500 mg | ORAL_TABLET | Freq: Two times a day (BID) | ORAL | 3 refills | Status: AC
Start: 2020-12-26 — End: ?

## 2020-12-26 MED ORDER — FUROSEMIDE 20 MG PO TABS
20.0000 mg | ORAL_TABLET | Freq: Every day | ORAL | Status: DC
  Administered 2020-12-26: 20 mg via ORAL
  Filled 2020-12-26: qty 1

## 2020-12-26 MED ORDER — TAMSULOSIN HCL 0.4 MG PO CAPS
0.4000 mg | ORAL_CAPSULE | Freq: Every day | ORAL | Status: DC
  Administered 2020-12-26: 0.4 mg via ORAL
  Filled 2020-12-26: qty 1

## 2020-12-26 MED ORDER — FLUTICASONE PROPIONATE 50 MCG/ACT NA SUSP: 1.0000 | Freq: Two times a day (BID) | NASAL | Status: DC | PRN

## 2020-12-26 MED ORDER — POTASSIUM CHLORIDE CRYS ER 20 MEQ PO TBCR
20.0000 meq | EXTENDED_RELEASE_TABLET | Freq: Every day | ORAL | Status: DC
  Administered 2020-12-26: 20 meq via ORAL
  Filled 2020-12-26: qty 1

## 2020-12-26 MED ORDER — HYDROCHLOROTHIAZIDE 25 MG PO TABS
25.0000 mg | ORAL_TABLET | Freq: Every day | ORAL | Status: DC
  Administered 2020-12-26: 25 mg via ORAL
  Filled 2020-12-26: qty 1

## 2020-12-26 MED ORDER — FINASTERIDE 5 MG PO TABS
5.0000 mg | ORAL_TABLET | Freq: Every day | ORAL | Status: DC
  Administered 2020-12-26: 5 mg via ORAL
  Filled 2020-12-26: qty 1

## 2020-12-26 NOTE — Progress Notes (Signed)
Patient given discharge instructions and stated understanding. 

## 2020-12-26 NOTE — Progress Notes (Signed)
Progress Note  Patient Name: Allen DuboisJerald J Tapper Date of Encounter: 12/26/2020  Primary Cardiologist: New   Subjective   Chest pain improved. Wants cath despite high risk for bleeding.   Inpatient Medications    Scheduled Meds: . aspirin EC  81 mg Oral Daily  . carvedilol  6.25 mg Oral BID WC  . diltiazem  120 mg Oral Daily  . furosemide  20 mg Oral Daily  . isosorbide mononitrate  30 mg Oral Daily  . pantoprazole  40 mg Oral Daily   Continuous Infusions:  PRN Meds: acetaminophen, HYDROcodone-acetaminophen, hydrocortisone, HYDROmorphone (DILAUDID) injection, ondansetron (ZOFRAN) IV   Vital Signs    Vitals:   12/25/20 1700 12/25/20 2030 12/26/20 0107 12/26/20 0616  BP: (!) 118/58 (!) 111/57 111/77 (!) 109/56  Pulse: 75 78  (!) 46  Resp: 16 17    Temp:  98 F (36.7 C) 98.4 F (36.9 C) 97.9 F (36.6 C)  TempSrc:  Oral Oral Oral  SpO2: 100% 100% 97% 97%  Weight:    90.7 kg  Height:        Intake/Output Summary (Last 24 hours) at 12/26/2020 0700 Last data filed at 12/26/2020 [id number] Gross per 24 hour  Intake 1140 ml  Output 150 ml  Net 990 ml   Filed Weights   12/24/20 2156 12/26/20 0616  Weight: 88 kg 90.7 kg    Physical Exam   General: Elderly, NAD Neck: Negative for carotid bruits. No JVD Lungs:Clear to ausculation bilaterally. No wheezes, rales, or rhonchi. Breathing is unlabored. Cardiovascular: Irregularly irregular. Abdomen: Soft, non-tender, non-distended. No obvious abdominal masses. Extremities: No edema. Radial pulses 2+ bilaterally Neuro: Alert and oriented. No focal deficits. No facial asymmetry. MAE spontaneously. Psych: Responds to questions appropriately with normal affect.    Labs    Chemistry Recent Labs  Lab 12/24/20 1652 12/25/20 0438  NA 135 135  K 2.8* 2.9*  CL 97* 98  CO2 25 26  GLUCOSE 164* 119*  BUN 8 11  CREATININE 1.05 1.13  CALCIUM 9.0 8.5*  GFRNONAA >60 >60  ANIONGAP 13 11     Hematology Recent Labs  Lab  12/24/20 1652 12/25/20 0438  WBC 13.3* 13.5*  RBC 5.06 4.39  HGB 9.3* 8.1*  HCT 34.0* 29.0*  MCV 67.2* 66.1*  MCH 18.4* 18.5*  MCHC 27.4* 27.9*  RDW 18.3* 17.9*  PLT 585* 534*    Cardiac EnzymesNo results for input(s): TROPONINI in the last 168 hours. No results for input(s): TROPIPOC in the last 168 hours.   BNPNo results for input(s): BNP, PROBNP in the last 168 hours.   DDimer No results for input(s): DDIMER in the last 168 hours.   Radiology    DG Chest 2 View  Result Date: 12/24/2020 CLINICAL DATA:  Chest pain and shortness of breath. Coronary artery disease. EXAM: CHEST - 2 VIEW COMPARISON:  04/07/2019 FINDINGS: The heart size and mediastinal contours are within normal limits. Coronary artery stent again noted. Both lungs are clear. The visualized skeletal structures are unremarkable. IMPRESSION: No active cardiopulmonary disease. Electronically Signed   By: Danae OrleansJohn A Stahl M.D.   On: 12/24/2020 17:17   ECHOCARDIOGRAM COMPLETE  Result Date: 12/25/2020    ECHOCARDIOGRAM REPORT   Patient Name:   Allen DuboisJERALD J Terry Date of Exam: 12/25/2020 Medical Rec #:  [id number]      Height:       68.0 in Accession #:    [id number]     Weight:  194.0 lb Date of Birth:  [birth year]      BSA:          2.017 m Patient Age:    84 years       BP:           133/71 mmHg Patient Gender: M              HR:           74 bpm. Exam Location:  Inpatient Procedure: 2D Echo, Cardiac Doppler and Color Doppler Indications:    R07.9* Chest pain, unspecified  History:        Patient has no prior history of Echocardiogram examinations.                 CAD; Risk Factors:Hypertension and Dyslipidemia.  Sonographer:    Tiffany Dance Referring Phys: [id number] CHING T TU IMPRESSIONS  1. Left ventricular ejection fraction, by estimation, is 55 to 60%. The left ventricle has normal function. The left ventricle has no regional wall motion abnormalities. There is mild concentric left ventricular hypertrophy. Left ventricular  diastolic function could not be evaluated.  2. Right ventricular systolic function is normal. The right ventricular size is normal. There is normal pulmonary artery systolic pressure.  3. Left atrial size was mildly dilated.  4. The mitral valve is normal in structure. Mild mitral valve regurgitation. No evidence of mitral stenosis. Moderate mitral annular calcification.  5. The aortic valve is normal in structure. Aortic valve regurgitation is trivial. Mild to moderate aortic valve sclerosis/calcification is present, without any evidence of aortic stenosis.  6. The inferior vena cava is normal in size with greater than 50% respiratory variability, suggesting right atrial pressure of 3 mmHg. FINDINGS  Left Ventricle: Left ventricular ejection fraction, by estimation, is 55 to 60%. The left ventricle has normal function. The left ventricle has no regional wall motion abnormalities. The left ventricular internal cavity size was normal in size. There is  mild concentric left ventricular hypertrophy. Left ventricular diastolic function could not be evaluated due to atrial fibrillation. Left ventricular diastolic function could not be evaluated. Right Ventricle: The right ventricular size is normal. No increase in right ventricular wall thickness. Right ventricular systolic function is normal. There is normal pulmonary artery systolic pressure. The tricuspid regurgitant velocity is 2.40 m/s, and  with an assumed right atrial pressure of 3 mmHg, the estimated right ventricular systolic pressure is 26.0 mmHg. Left Atrium: Left atrial size was mildly dilated. Right Atrium: Right atrial size was normal in size. Pericardium: There is no evidence of pericardial effusion. Mitral Valve: The mitral valve is normal in structure. There is mild thickening of the mitral valve leaflet(s). There is mild calcification of the mitral valve leaflet(s). Moderate mitral annular calcification. Mild mitral valve regurgitation. No evidence of  mitral valve stenosis. Tricuspid Valve: The tricuspid valve is normal in structure. Tricuspid valve regurgitation is mild . No evidence of tricuspid stenosis. Aortic Valve: The aortic valve is normal in structure. Aortic valve regurgitation is trivial. Mild to moderate aortic valve sclerosis/calcification is present, without any evidence of aortic stenosis. Pulmonic Valve: The pulmonic valve was normal in structure. Pulmonic valve regurgitation is not visualized. No evidence of pulmonic stenosis. Aorta: The aortic root is normal in size and structure. Venous: The inferior vena cava is normal in size with greater than 50% respiratory variability, suggesting right atrial pressure of 3 mmHg. IAS/Shunts: No atrial level shunt detected by color flow Doppler. Additional Comments: A is visualized  in the right atrium and right ventricle.  LEFT VENTRICLE PLAX 2D LVIDd:         5.30 cm LVIDs:         3.20 cm LV PW:         1.10 cm LV IVS:        1.00 cm LVOT diam:     2.20 cm LV SV:         71 LV SV Index:   35 LVOT Area:     3.80 cm  RIGHT VENTRICLE          IVC RV Basal diam:  2.80 cm  IVC diam: 2.00 cm TAPSE (M-mode): 2.1 cm LEFT ATRIUM             Index       RIGHT ATRIUM           Index LA diam:        4.20 cm 2.08 cm/m  RA Area:     17.10 cm LA Vol (A2C):   82.9 ml 41.09 ml/m RA Volume:   44.30 ml  21.96 ml/m LA Vol (A4C):   48.1 ml 23.84 ml/m LA Biplane Vol: 64.8 ml 32.12 ml/m  AORTIC VALVE LVOT Vmax:   98.95 cm/s LVOT Vmean:  66.900 cm/s LVOT VTI:    0.186 m  AORTA Ao Root diam: 4.10 cm Ao Asc diam:  3.90 cm MITRAL VALVE                TRICUSPID VALVE MV Area (PHT): 3.21 cm     TR Peak grad:   23.0 mmHg MV Decel Time: 236 msec     TR Vmax:        240.00 cm/s MV E velocity: 106.00 cm/s                             SHUNTS                             Systemic VTI:  0.19 m                             Systemic Diam: 2.20 cm Tobias Alexander MD Electronically signed by Tobias Alexander MD Signature Date/Time:  12/25/2020/10:48:35 AM    Final    Telemetry    12/26/20 AF with rates in the 80's - Personally Reviewed  ECG    No new tracing as of 12/26/2020- Personally Reviewed  Cardiac Studies   LHC 01/01/2019   Non-stenotic Prox LAD lesion was previously treated.  Previously placed Ost 2nd Mrg drug eluting stent is widely patent.  Patent RCA stent.  Prox LAD to Mid LAD lesion is 80% stenosed.  A drug-eluting stent was successfully placed using a STENT SYNERGY DES 2.5X20.  Post intervention, there is a 0% residual stenosis.  The left ventricular systolic function is normal.  LV end diastolic pressure is normal. LVEDP 13 mm Hg.  The left ventricular ejection fraction is 55-65% by visual estimate.  There is no aortic valve stenosis.  Ost 1st Diag lesion is 70% stenosed.  Progression of LAD and diagonal disease compared to prior.   LAD treated. Medical therapy for diagonal. Consider clopidogrel monotherapy after 1 year.  Effient not used due to his age. He was unclear why this was used in the past.  Possible discharge later today.  Patient Profile     85 y.o. male with paroxysmal atrial fibrillation not on anticoagulation due to GI bleeding, CAD status post PCI in the past, iron deficiency anemia secondary to possible gastric AVMs, medication noncompliance who was admitted on 12/24/2020 with chest pain concerning for unstable angina.  Assessment & Plan    1. Chest pain: -Patient presented with a 2-week history of intermittent chest pain with rest and exertion.  EKG with atrial fibrillation and RBBB and no acute changes.  Troponins remain negative -Previously underwent PCI to the mid LAD 12/2018 with residual small diagonal branch disease -HsT, 8>8>7 -Long history of chronic iron deficiency anemia with gastric AVMs not on antiplatelet or anticoagulants due to recurrent GI bleeding -On presentation patient was given ASA and heparin with a drop in hemoglobin from 9.3-8.1  felt not to be a candidate for cardiac catheterization due to this>> repeat CBC? -Stress test discussed at length however patient deferred>>prefers to proceed with LHC despite increased risk of bleeding.  -Echocardiogram with LVEF at 55-60% with no RWMA, no valvular disease   2.  Persistent atrial fibrillation: -Rates controlled int eh 70-80's  -Not on anticoagulation due to GI bleeding and recent drop in hemoglobin -Continue to hold heparin -Continue carvedilol, diltiazem  Signed, Georgie Chard NP-[facility] Pager: [phone] 12/26/2020, 7:00 AM     For questions or updates, please contact   Please consult [email or web address] for contact info under Cardiology/STEMI.

## 2020-12-26 NOTE — Telephone Encounter (Signed)
-----   Message from Filbert Schilder, NP sent at 12/26/2020 11:53 AM EST ----- Regarding: follow up appointment Please schedule this patient for a hospital follow up appointment. He was discharged today, 12/26/20. Please call and let him know the date and time of appointment.   Thank you Noreene Larsson

## 2020-12-26 NOTE — Evaluation (Signed)
Physical Therapy Evaluation Patient Details Name: Allen Terry MRN: [id number] DOB: 12-18-1935 Today's Date: 12/26/2020   History of Present Illness  Pt is an 85 y/o male admitted secondary to atypical chest pain. PMH includes HTN, a fib, CAD, and GI bleed.  Clinical Impression  Pt admitted secondary to problem above with deficits below. Pt requiring min guard to supervision for mobility tasks. Decreased awareness of safety noted, and pt not very receptive to cues during mobility. Pt reports he lives alone and has family to call and check on him. Discussed HHPT, however, pt refusing at this time stating he "didn't want anyone getting in his way". Will continue to follow acutely to maximize functional mobility independence and safety.     Follow Up Recommendations No PT follow up (Pt refusing HHPT)    Equipment Recommendations  None recommended by PT    Recommendations for Other Services       Precautions / Restrictions Precautions Precautions: Fall Restrictions Weight Bearing Restrictions: No      Mobility  Bed Mobility Overal bed mobility: Independent                  Transfers Overall transfer level: Needs assistance Equipment used: Straight cane Transfers: Sit to/from Stand Sit to Stand: Supervision         General transfer comment: Supervision for safety.  Ambulation/Gait Ambulation/Gait assistance: Min guard;Supervision Gait Distance (Feet): 200 Feet Assistive device: Straight cane Gait Pattern/deviations: Step-through pattern;Decreased stride length;Antalgic;Decreased weight shift to left Gait velocity: Decreased   General Gait Details: Pt with mildly antalgic gait. Leaving cane behind him, but not very receptive to cues. Reporting mild dizziness, but did not seem to affect mobility.  Stairs            Wheelchair Mobility    Modified Rankin (Stroke Patients Only)       Balance Overall balance assessment: Mild deficits observed, not  formally tested                                           Pertinent Vitals/Pain Pain Assessment: Faces Faces Pain Scale: Hurts a little bit Pain Location: LLE Pain Descriptors / Indicators: Grimacing;Guarding Pain Intervention(s): Monitored during session;Limited activity within patient's tolerance;Repositioned    Home Living Family/patient expects to be discharged to:: Private residence Living Arrangements: Alone Available Help at Discharge: Family;Available PRN/intermittently Type of Home: House Home Access: Stairs to enter Entrance Stairs-Rails: None Entrance Stairs-Number of Steps: 3 Home Layout: One level Home Equipment: Shower seat;Cane - single point;Grab bars - tub/shower      Prior Function Level of Independence: Independent with assistive device(s)         Comments: Reports using cane occasionally     Hand Dominance        Extremity/Trunk Assessment   Upper Extremity Assessment Upper Extremity Assessment: Generalized weakness    Lower Extremity Assessment Lower Extremity Assessment: Generalized weakness;LLE deficits/detail LLE Deficits / Details: Reports LLE "bursitis"    Cervical / Trunk Assessment Cervical / Trunk Assessment: Kyphotic  Communication   Communication: No difficulties  Cognition Arousal/Alertness: Awake/alert Behavior During Therapy: WFL for tasks assessed/performed Overall Cognitive Status: No family/caregiver present to determine baseline cognitive functioning                                 General  Comments: Likely close to baseline. Pt very independent in ways he chooses to do things.      General Comments      Exercises     Assessment/Plan    PT Assessment Patient needs continued PT services  PT Problem List Decreased mobility;Decreased balance;Decreased knowledge of precautions;Decreased safety awareness       PT Treatment Interventions Gait training;DME instruction;Stair  training;Functional mobility training;Therapeutic activities;Therapeutic exercise;Balance training;Patient/family education    PT Goals (Current goals can be found in the Care Plan section)  Acute Rehab PT Goals Patient Stated Goal: to go home PT Goal Formulation: With patient Time For Goal Achievement: 01/09/21 Potential to Achieve Goals: Fair    Frequency Min 3X/week   Barriers to discharge Decreased caregiver support      Co-evaluation               AM-PAC PT "6 Clicks" Mobility  Outcome Measure Help needed turning from your back to your side while in a flat bed without using bedrails?: None Help needed moving from lying on your back to sitting on the side of a flat bed without using bedrails?: None Help needed moving to and from a bed to a chair (including a wheelchair)?: A Little Help needed standing up from a chair using your arms (e.g., wheelchair or bedside chair)?: A Little Help needed to walk in hospital room?: A Little Help needed climbing 3-5 steps with a railing? : A Lot 6 Click Score: 19    End of Session Equipment Utilized During Treatment: Gait belt Activity Tolerance: Patient tolerated treatment well Patient left: in bed;with call bell/phone within reach Nurse Communication: Mobility status PT Visit Diagnosis: Other abnormalities of gait and mobility (R26.89);Muscle weakness (generalized) (M62.81)    Time: 9528-4132 PT Time Calculation (min) (ACUTE ONLY): 15 min   Charges:   PT Evaluation $PT Eval Low Complexity: 1 Low          Cindee Salt, DPT  Acute Rehabilitation Services  Pager: [phone] Office: [phone]   Le[facility] Prom 12/26/2020, 9:35 AM

## 2020-12-26 NOTE — Discharge Summary (Signed)
Physician Discharge Summary  Allen DuboisJerald J Terry DGL:[id number] DOB: May 02, 1936 DOA: 12/24/2020  PCP: Oneita HurtPcp, No  Admit date: 12/24/2020 Discharge date: 12/26/2020  Admitted From: Home Disposition:  Home  Recommendations for Outpatient Follow-up:  1. Follow up with PCP in 1-2 weeks 2. Please obtain BMP/CBC in one week 3. Follow-up with cardiology as an outpatient  Home Health:No Equipment/Devices:None  Discharge Condition:stable CODE STATUS:Full Diet recommendation: Heart Healthy   Brief/Interim Summary: 85 y.o. male past medical history significant for CAD status post DES x4, atrial fibrillation on anticoagulation due to GI bleed, gastric AVM and intolerance to dual antiplatelet therapy, essential hypertension iron deficiency anemia comes into the hospital for chest pain last cath was on 3/20 with a patent second marginal and a right ICA stent with restenosis of the proximal and mid LAD which were restented  Discharge Diagnoses:  Principal Problem:   Atypical chest pain Active Problems:   HTN (hypertension)   Pre-diabetes   Hypokalemia   Atrial fibrillation with RVR (HCC)   Iron deficiency anemia  Atypical chest pain: Cardiology recommended cycle his cardiac enzymes. EKG was done that showed no ST segment abnormalities, 2D echo was done that showed no wall motion abnormality and EF of 55% mild concentric hypertrophy the inferior vena cava had 50% respiratory variability. Cardiology recommended no cardiac cath at this time as cardiac biomarkers remain flat and no wall motion on echo, heparin was discontinued. There was a mild drop in hemoglobin from 9.3-8.1 but no new source of bleeding, he denies any melanotic stools or bright red blood per rectum. He will need to follow-up with cardiology as an outpatient.  A. fib with RVR: He was started on IV diltiazem then transition to oral diltiazem and metoprolol he remained in A. fib not on anticoagulation candidate as he was last medical  history of multiple GI bleeds.  Hypokalemia: Was repleted only renal renal.  Essential hypertension: No change made to his blood blood pressure medications.  Prediabetes mellitus: Follow-up with PCP as an outpatient check an A1c.  History of iron deficiency anemia with a history of GI bleed/gastric AVMs: He denies any bright red blood per rectum but his stools are black not melanotic. He will continue his iron therapy as an outpatient.  There was a mild drop in hemoglobin from 9.3-8.1 but he denied any bright red blood per rectum or melanotic stools. There were no signs of bleeding. Continue Protonix p.o. twice daily.  Discharge Instructions  Discharge Instructions    Diet - low sodium heart healthy   Complete by: As directed    Increase activity slowly   Complete by: As directed      Allergies as of 12/26/2020      Reactions   Ferrous Sulfate Other (See Comments)   Face burning   Meclizine Other (See Comments)   Burning in face when taking iron   Metoprolol Other (See Comments)   abd pain   Pravastatin Other (See Comments)   Myalgias, even when combined with coenzyme q 10   Ticagrelor Other (See Comments)   Cough, sneezing and nasal congestion   Lisinopril Cough   Penicillins Hives, Rash   Did it involve swelling of the face/tongue/throat, SOB, or low BP? No Did it involve sudden or severe rash/hives, skin peeling, or any reaction on the inside of your mouth or nose? No Did you need to seek medical attention at a hospital or doctor's office? Yes When did it last happen?40 years If all above answers are "NO",  may proceed with cephalosporin use.      Medication List    STOP taking these medications   furosemide 20 MG tablet Commonly known as: LASIX     TAKE these medications   acetaminophen 500 MG tablet Commonly known as: TYLENOL Take 1,000 mg by mouth every 6 (six) hours as needed for mild pain.   aspirin EC 81 MG tablet Take 81 mg by mouth daily.    carvedilol 6.25 MG tablet Commonly known as: COREG Take 1 tablet (6.25 mg total) by mouth 2 (two) times daily with a meal. What changed:   medication strength  how much to take   clobetasol cream 0.05 % Commonly known as: TEMOVATE Apply 1 application topically 2 (two) times daily.   diclofenac Sodium 1 % Gel Commonly known as: VOLTAREN Apply 1 application topically 4 (four) times daily.   diltiazem 120 MG 24 hr capsule Commonly known as: DILACOR XR Take 120 mg by mouth daily.   finasteride 5 MG tablet Commonly known as: PROSCAR Take 5 mg by mouth daily.   fluticasone 50 MCG/ACT nasal spray Commonly known as: FLONASE Place 1 spray into both nostrils 2 (two) times daily as needed for allergies.   hydrochlorothiazide 25 MG tablet Commonly known as: HYDRODIURIL Take 25 mg by mouth daily.   isosorbide mononitrate 30 MG 24 hr tablet Commonly known as: IMDUR Take 1 tablet (30 mg total) by mouth daily.   nitroGLYCERIN 0.4 MG SL tablet Commonly known as: NITROSTAT Place 1 tablet (0.4 mg total) under the tongue every 5 (five) minutes x 3 doses as needed for chest pain.   pantoprazole 40 MG tablet Commonly known as: PROTONIX Take 40 mg by mouth daily.   potassium chloride SA 20 MEQ tablet Commonly known as: KLOR-CON Take 20 mEq by mouth daily.   tamsulosin 0.4 MG Caps capsule Commonly known as: FLOMAX Take 0.4 mg by mouth daily.       Allergies  Allergen Reactions  . Ferrous Sulfate Other (See Comments)    Face burning  . Meclizine Other (See Comments)    Burning in face when taking iron  . Metoprolol Other (See Comments)    abd pain  . Pravastatin Other (See Comments)    Myalgias, even when combined with coenzyme q 10  . Ticagrelor Other (See Comments)    Cough, sneezing and nasal congestion  . Lisinopril Cough  . Penicillins Hives and Rash    Did it involve swelling of the face/tongue/throat, SOB, or low BP? No Did it involve sudden or severe  rash/hives, skin peeling, or any reaction on the inside of your mouth or nose? No Did you need to seek medical attention at a hospital or doctor's office? Yes When did it last happen?40 years If all above answers are "NO", may proceed with cephalosporin use.    Consultations:  Cards   Procedures/Studies: DG Chest 2 View  Result Date: 12/24/2020 CLINICAL DATA:  Chest pain and shortness of breath. Coronary artery disease. EXAM: CHEST - 2 VIEW COMPARISON:  04/07/2019 FINDINGS: The heart size and mediastinal contours are within normal limits. Coronary artery stent again noted. Both lungs are clear. The visualized skeletal structures are unremarkable. IMPRESSION: No active cardiopulmonary disease. Electronically Signed   By: Danae Orleans M.D.   On: 12/24/2020 17:17   ECHOCARDIOGRAM COMPLETE  Result Date: 12/25/2020    ECHOCARDIOGRAM REPORT   Patient Name:   Allen Terry Fornwalt Date of Exam: 12/25/2020 Medical Rec #:  [id number]  Height:       68.0 in Accession #:    [id number]     Weight:       194.0 lb Date of Birth:  [birth year]      BSA:          2.017 m Patient Age:    84 years       BP:           133/71 mmHg Patient Gender: M              HR:           74 bpm. Exam Location:  Inpatient Procedure: 2D Echo, Cardiac Doppler and Color Doppler Indications:    R07.9* Chest pain, unspecified  History:        Patient has no prior history of Echocardiogram examinations.                 CAD; Risk Factors:Hypertension and Dyslipidemia.  Sonographer:    Tiffany Dance Referring Phys: [id number] CHING T TU IMPRESSIONS  1. Left ventricular ejection fraction, by estimation, is 55 to 60%. The left ventricle has normal function. The left ventricle has no regional wall motion abnormalities. There is mild concentric left ventricular hypertrophy. Left ventricular diastolic function could not be evaluated.  2. Right ventricular systolic function is normal. The right ventricular size is normal. There is normal pulmonary  artery systolic pressure.  3. Left atrial size was mildly dilated.  4. The mitral valve is normal in structure. Mild mitral valve regurgitation. No evidence of mitral stenosis. Moderate mitral annular calcification.  5. The aortic valve is normal in structure. Aortic valve regurgitation is trivial. Mild to moderate aortic valve sclerosis/calcification is present, without any evidence of aortic stenosis.  6. The inferior vena cava is normal in size with greater than 50% respiratory variability, suggesting right atrial pressure of 3 mmHg. FINDINGS  Left Ventricle: Left ventricular ejection fraction, by estimation, is 55 to 60%. The left ventricle has normal function. The left ventricle has no regional wall motion abnormalities. The left ventricular internal cavity size was normal in size. There is  mild concentric left ventricular hypertrophy. Left ventricular diastolic function could not be evaluated due to atrial fibrillation. Left ventricular diastolic function could not be evaluated. Right Ventricle: The right ventricular size is normal. No increase in right ventricular wall thickness. Right ventricular systolic function is normal. There is normal pulmonary artery systolic pressure. The tricuspid regurgitant velocity is 2.40 m/s, and  with an assumed right atrial pressure of 3 mmHg, the estimated right ventricular systolic pressure is 26.0 mmHg. Left Atrium: Left atrial size was mildly dilated. Right Atrium: Right atrial size was normal in size. Pericardium: There is no evidence of pericardial effusion. Mitral Valve: The mitral valve is normal in structure. There is mild thickening of the mitral valve leaflet(s). There is mild calcification of the mitral valve leaflet(s). Moderate mitral annular calcification. Mild mitral valve regurgitation. No evidence of mitral valve stenosis. Tricuspid Valve: The tricuspid valve is normal in structure. Tricuspid valve regurgitation is mild . No evidence of tricuspid stenosis.  Aortic Valve: The aortic valve is normal in structure. Aortic valve regurgitation is trivial. Mild to moderate aortic valve sclerosis/calcification is present, without any evidence of aortic stenosis. Pulmonic Valve: The pulmonic valve was normal in structure. Pulmonic valve regurgitation is not visualized. No evidence of pulmonic stenosis. Aorta: The aortic root is normal in size and structure. Venous: The inferior vena cava is normal in size with greater  than 50% respiratory variability, suggesting right atrial pressure of 3 mmHg. IAS/Shunts: No atrial level shunt detected by color flow Doppler. Additional Comments: A is visualized in the right atrium and right ventricle.  LEFT VENTRICLE PLAX 2D LVIDd:         5.30 cm LVIDs:         3.20 cm LV PW:         1.10 cm LV IVS:        1.00 cm LVOT diam:     2.20 cm LV SV:         71 LV SV Index:   35 LVOT Area:     3.80 cm  RIGHT VENTRICLE          IVC RV Basal diam:  2.80 cm  IVC diam: 2.00 cm TAPSE (M-mode): 2.1 cm LEFT ATRIUM             Index       RIGHT ATRIUM           Index LA diam:        4.20 cm 2.08 cm/m  RA Area:     17.10 cm LA Vol (A2C):   82.9 ml 41.09 ml/m RA Volume:   44.30 ml  21.96 ml/m LA Vol (A4C):   48.1 ml 23.84 ml/m LA Biplane Vol: 64.8 ml 32.12 ml/m  AORTIC VALVE LVOT Vmax:   98.95 cm/s LVOT Vmean:  66.900 cm/s LVOT VTI:    0.186 m  AORTA Ao Root diam: 4.10 cm Ao Asc diam:  3.90 cm MITRAL VALVE                TRICUSPID VALVE MV Area (PHT): 3.21 cm     TR Peak grad:   23.0 mmHg MV Decel Time: 236 msec     TR Vmax:        240.00 cm/s MV E velocity: 106.00 cm/s                             SHUNTS                             Systemic VTI:  0.19 m                             Systemic Diam: 2.20 cm Tobias Alexander MD Electronically signed by Tobias Alexander MD Signature Date/Time: 12/25/2020/10:48:35 AM    Final    (Echo, Carotid, EGD, Colonoscopy, ERCP)    Subjective: No complains  Discharge Exam: Vitals:   12/26/20 0616 12/26/20 0833   BP: (!) 109/56 113/68  Pulse: (!) 46 90  Resp:  16  Temp: 97.9 F (36.6 C) 97.9 F (36.6 C)  SpO2: 97% 98%   Vitals:   12/25/20 2030 12/26/20 0107 12/26/20 0616 12/26/20 0833  BP: (!) 111/57 111/77 (!) 109/56 113/68  Pulse: 78  (!) 46 90  Resp: 17   16  Temp: 98 F (36.7 C) 98.4 F (36.9 C) 97.9 F (36.6 C) 97.9 F (36.6 C)  TempSrc: Oral Oral Oral Oral  SpO2: 100% 97% 97% 98%  Weight:   90.7 kg   Height:        General: Pt is alert, awake, not in acute distress Cardiovascular: RRR, S1/S2 +, no rubs, no gallops Respiratory: CTA bilaterally, no wheezing, no rhonchi Abdominal: Soft, NT, ND,  bowel sounds + Extremities: no edema, no cyanosis    The results of significant diagnostics from this hospitalization (including imaging, microbiology, ancillary and laboratory) are listed below for reference.     Microbiology: Recent Results (from the past 240 hour(s))  SARS CORONAVIRUS 2 (TAT 6-24 HRS) Nasopharyngeal Nasopharyngeal Swab     Status: None   Collection Time: 12/24/20  9:01 PM   Specimen: Nasopharyngeal Swab  Result Value Ref Range Status   SARS Coronavirus 2 NEGATIVE NEGATIVE Final    Comment: (NOTE) SARS-CoV-2 target nucleic acids are NOT DETECTED.  The SARS-CoV-2 RNA is generally detectable in upper and lower respiratory specimens during the acute phase of infection. Negative results do not preclude SARS-CoV-2 infection, do not rule out co-infections with other pathogens, and should not be used as the sole basis for treatment or other patient management decisions. Negative results must be combined with clinical observations, patient history, and epidemiological information. The expected result is Negative.  Fact Sheet for Patients: HairSlick.no  Fact Sheet for Healthcare Providers: quierodirigir.com  This test is not yet approved or cleared by the [place] FDA and  has been authorized for  detection and/or diagnosis of SARS-CoV-2 by FDA under an Emergency Use Authorization (EUA). This EUA will remain  in effect (meaning this test can be used) for the duration of the COVID-19 declaration under Se ction 564(b)(1) of the Act, 21 U.S.C. section 360bbb-3(b)(1), unless the authorization is terminated or revoked sooner.  Performed at [facility], [address], [place], [place] [address]      Labs: BNP (last 3 results) No results for input(s): BNP in the last 8760 hours. Basic Metabolic Panel: Recent Labs  Lab 12/24/20 1652 12/25/20 0438 12/26/20 0714  NA 135 135 133*  K 2.8* 2.9* 4.0  CL 97* 98 100  CO2 GLUCOSE 164* 119* 129*  BUN CREATININE 1.05 1.13 0.97  CALCIUM 9.0 8.5* 8.5*  MG  --   --  2.5*   Liver Function Tests: No results for input(s): AST, ALT, ALKPHOS, BILITOT, PROT, ALBUMIN in the last 168 hours. No results for input(s): LIPASE, AMYLASE in the last 168 hours. No results for input(s): AMMONIA in the last 168 hours. CBC: Recent Labs  Lab 12/24/20 1652 12/25/20 0438 12/26/20 0714  WBC 13.3* 13.5* 11.0*  HGB 9.3* 8.1* 8.0*  HCT 34.0* 29.0* 29.0*  MCV 67.2* 66.1* 67.4*  PLT 585* 534* 498*   Cardiac Enzymes: No results for input(s): CKTOTAL, CKMB, CKMBINDEX, TROPONINI in the last 168 hours. BNP: Invalid input(s): POCBNP CBG: No results for input(s): GLUCAP in the last 168 hours. D-Dimer No results for input(s): DDIMER in the last 72 hours. Hgb A1c Recent Labs    12/24/20 1652 12/25/20 0438  HGBA1C 6.5* 6.5*   Lipid Profile Recent Labs    12/24/20 2005  CHOL 141  HDL 22*  LDLCALC 83  TRIG 604*  CHOLHDL 6.4   Thyroid function studies Recent Labs    12/24/20 2255  TSH 2.766   Anemia work up Recent Labs    12/25/20 0438  TIBC 490*  IRON 14*   Urinalysis No results found for: COLORURINE, APPEARANCEUR, LABSPEC, PHURINE, GLUCOSEU, HGBUR, BILIRUBINUR, KETONESUR, PROTEINUR, UROBILINOGEN, NITRITE,  LEUKOCYTESUR Sepsis Labs Invalid input(s): PROCALCITONIN,  WBC,  LACTICIDVEN Microbiology Recent Results (from the past 240 hour(s))  SARS CORONAVIRUS 2 (TAT 6-24 HRS) Nasopharyngeal Nasopharyngeal Swab     Status: None   Collection Time: 12/24/20  9:01 PM   Specimen: Nasopharyngeal Swab  Result Value Ref Range Status   SARS Coronavirus 2 NEGATIVE NEGATIVE Final    Comment: (NOTE) SARS-CoV-2 target nucleic acids are NOT DETECTED.  The SARS-CoV-2 RNA is generally detectable in upper and lower respiratory specimens during the acute phase of infection. Negative results do not preclude SARS-CoV-2 infection, do not rule out co-infections with other pathogens, and should not be used as the sole basis for treatment or other patient management decisions. Negative results must be combined with clinical observations, patient history, and epidemiological information. The expected result is Negative.  Fact Sheet for Patients: HairSlick.no  Fact Sheet for Healthcare Providers: quierodirigir.com  This test is not yet approved or cleared by the [place] FDA and  has been authorized for detection and/or diagnosis of SARS-CoV-2 by FDA under an Emergency Use Authorization (EUA). This EUA will remain  in effect (meaning this test can be used) for the duration of the COVID-19 declaration under Se ction 564(b)(1) of the Act, 21 U.S.C. section 360bbb-3(b)(1), unless the authorization is terminated or revoked sooner.  Performed at [facility], [address] [place], [place] [address]      Time coordinating discharge: Over 30 minutes  SIGNED:   Marinda Elk, MD  [facility] 12/26/2020, 9:09 AM Pager   If 7PM-7AM, please contact night-coverage [email or web address] Password [email or web address]

## 2020-12-26 NOTE — Plan of Care (Signed)
Problem: Education: Goal: Knowledge of disease or condition will improve 12/26/2020 1027 by Coy Saunas, RN Outcome: Adequate for Discharge 12/26/2020 1027 by Coy Saunas, RN Outcome: Adequate for Discharge Goal: Understanding of medication regimen will improve 12/26/2020 1027 by Coy Saunas, RN Outcome: Adequate for Discharge 12/26/2020 1027 by Coy Saunas, RN Outcome: Adequate for Discharge Goal: Individualized Educational Video(s) 12/26/2020 1027 by Coy Saunas, RN Outcome: Adequate for Discharge 12/26/2020 1027 by Coy Saunas, RN Outcome: Adequate for Discharge   Problem: Activity: Goal: Ability to tolerate increased activity will improve 12/26/2020 1027 by Coy Saunas, RN Outcome: Adequate for Discharge 12/26/2020 1027 by Coy Saunas, RN Outcome: Adequate for Discharge   Problem: Cardiac: Goal: Ability to achieve and maintain adequate cardiopulmonary perfusion will improve 12/26/2020 1027 by Coy Saunas, RN Outcome: Adequate for Discharge 12/26/2020 1027 by Coy Saunas, RN Outcome: Adequate for Discharge   Problem: Health Behavior/Discharge Planning: Goal: Ability to safely manage health-related needs after discharge will improve 12/26/2020 1027 by Coy Saunas, RN Outcome: Adequate for Discharge 12/26/2020 1027 by Coy Saunas, RN Outcome: Adequate for Discharge   Problem: Education: Goal: Knowledge of General Education information will improve Description: Including pain rating scale, medication(s)/side effects and non-pharmacologic comfort measures 12/26/2020 1027 by Coy Saunas, RN Outcome: Adequate for Discharge 12/26/2020 1027 by Coy Saunas, RN Outcome: Adequate for Discharge   Problem: Health Behavior/Discharge Planning: Goal: Ability to manage health-related needs will improve 12/26/2020 1027 by Coy Saunas, RN Outcome: Adequate for Discharge 12/26/2020 1027 by Coy Saunas, RN Outcome: Adequate for Discharge   Problem: Clinical  Measurements: Goal: Ability to maintain clinical measurements within normal limits will improve 12/26/2020 1027 by Coy Saunas, RN Outcome: Adequate for Discharge 12/26/2020 1027 by Coy Saunas, RN Outcome: Adequate for Discharge Goal: Will remain free from infection 12/26/2020 1027 by Coy Saunas, RN Outcome: Adequate for Discharge 12/26/2020 1027 by Coy Saunas, RN Outcome: Adequate for Discharge Goal: Diagnostic test results will improve 12/26/2020 1027 by Coy Saunas, RN Outcome: Adequate for Discharge 12/26/2020 1027 by Coy Saunas, RN Outcome: Adequate for Discharge Goal: Respiratory complications will improve 12/26/2020 1027 by Coy Saunas, RN Outcome: Adequate for Discharge 12/26/2020 1027 by Coy Saunas, RN Outcome: Adequate for Discharge Goal: Cardiovascular complication will be avoided 12/26/2020 1027 by Coy Saunas, RN Outcome: Adequate for Discharge 12/26/2020 1027 by Coy Saunas, RN Outcome: Adequate for Discharge   Problem: Activity: Goal: Risk for activity intolerance will decrease 12/26/2020 1027 by Coy Saunas, RN Outcome: Adequate for Discharge 12/26/2020 1027 by Coy Saunas, RN Outcome: Adequate for Discharge   Problem: Nutrition: Goal: Adequate nutrition will be maintained 12/26/2020 1027 by Coy Saunas, RN Outcome: Adequate for Discharge 12/26/2020 1027 by Coy Saunas, RN Outcome: Adequate for Discharge   Problem: Coping: Goal: Level of anxiety will decrease 12/26/2020 1027 by Coy Saunas, RN Outcome: Adequate for Discharge 12/26/2020 1027 by Coy Saunas, RN Outcome: Adequate for Discharge   Problem: Elimination: Goal: Will not experience complications related to bowel motility 12/26/2020 1027 by Coy Saunas, RN Outcome: Adequate for Discharge 12/26/2020 1027 by Coy Saunas, RN Outcome: Adequate for Discharge Goal: Will not experience complications related to urinary retention 12/26/2020 1027 by Coy Saunas, RN Outcome:  Adequate for Discharge 12/26/2020 1027 by Coy Saunas, RN Outcome: Adequate for Discharge   Problem: Pain Managment: Goal: General experience  of comfort will improve 12/26/2020 1027 by Coy Saunas, RN Outcome: Adequate for Discharge 12/26/2020 1027 by Coy Saunas, RN Outcome: Adequate for Discharge   Problem: Safety: Goal: Ability to remain free from injury will improve 12/26/2020 1027 by Coy Saunas, RN Outcome: Adequate for Discharge 12/26/2020 1027 by Coy Saunas, RN Outcome: Adequate for Discharge   Problem: Skin Integrity: Goal: Risk for impaired skin integrity will decrease 12/26/2020 1027 by Coy Saunas, RN Outcome: Adequate for Discharge 12/26/2020 1027 by Coy Saunas, RN Outcome: Adequate for Discharge   Problem: Acute Rehab PT Goals(only PT should resolve) Goal: Pt Will Ambulate Outcome: Adequate for Discharge Goal: Pt Will Go Up/Down Stairs Outcome: Adequate for Discharge

## 2020-12-26 NOTE — Progress Notes (Signed)
[facility] PROGRESS NOTE    Progress Note  Allen Terry  HEN:[id number] DOB: 1936/07/19 DOA: 12/24/2020 PCP: Pcp, No     Brief Narrative:   Allen Terry is an 85 y.o. male past medical history significant for CAD status post DES x4, atrial fibrillation on anticoagulation due to GI bleed, gastric AVM and intolerance to dual antiplatelet therapy, essential hypertension iron deficiency anemia comes into the hospital for chest pain last cath was on 3/20 with a patent second marginal and a right ICA stent with restenosis of the proximal and mid LAD which were restented.  Significant studies: Chest x-ray showed no active cardiopulmonary disease  Antibiotics: None  Microbiology data: Blood culture:  Procedures: None  Assessment/Plan:   Acute coronary syndrome/ Unstable angina [facility]): Cardiology was consulted recommended to continue aspirin, diltiazem, Imdur and metoprolol. Cardiac biomarkers have been negative. 2D echo was done that showed an EF of 55% with mild concentric hypertrophy aortic valve normal structure no AAS inferior vena cava with 50% respiratory variability. He has had a long history of chronic iron deficiency anemia with gastric AVMs and a mild drop in hemoglobin from 9.3-8.1 while you have source of bleeding please iron deficiency at home.  He relates no signs of overt bleeding. Cardiology recommended he is not a cardiac catheterization candidate, especially if he continues to drop his hemoglobin. We will probably need to consult hospice and palliative care to drive start addressing end-of-life. As it seems that his comorbidities and intolerance to medication are significantly impacted his treatment.  A. fib with RVR: Continue oral diltiazem and metoprolol, he remains in A. fib Controlled on current regimen. Not at anticoagulation candidate due to history of GI bleed.  Hypokalemia: Basic metabolic panel this morning is pending.  Essential HTN  (hypertension) Blood pressure seems to be fairly controlled continue metoprolol and diltiazem.  Pre-diabetes: Continue sliding scale insulin.  Iron deficiency anemia with a history of GI bleed/gastric AVMs: No signs of melanotic stools he is on iron supplement. There was a drop in hemoglobin from 9.3-8.1 while he was temporary on heparin.  No signs of overt bleeding. Hemoglobin this morning is pending continue Protonix p.o. twice daily.    DVT prophylaxis: heparin Family Communication:none Status is: Observation  The patient remains OBS appropriate and will d/c before 2 midnights.  Dispo: The patient is from: Home              Anticipated d/c is to: Home              Patient currently is not medically stable to d/c.   Difficult to place patient No   Code Status:     Code Status Orders  (From admission, onward)         Start     Ordered   12/24/20 1942  Full code  Continuous        12/24/20 1941        Code Status History    Date Active Date Inactive Code Status Order ID Comments User Context   12/31/2018 1714 01/02/2019 1705 Full Code [id number]  Leone Brand, NP ED   02/12/2016 2055 02/14/2016 1344 Full Code [id number]  Arty Baumgartner, NP Inpatient   Advance Care Planning Activity        IV Access:    Peripheral IV   Procedures and diagnostic studies:   DG Chest 2 View  Result Date: 12/24/2020 CLINICAL DATA:  Chest pain and shortness of breath. Coronary artery disease.  EXAM: CHEST - 2 VIEW COMPARISON:  04/07/2019 FINDINGS: The heart size and mediastinal contours are within normal limits. Coronary artery stent again noted. Both lungs are clear. The visualized skeletal structures are unremarkable. IMPRESSION: No active cardiopulmonary disease. Electronically Signed   By: Danae Orleans M.D.   On: 12/24/2020 17:17   ECHOCARDIOGRAM COMPLETE  Result Date: 12/25/2020    ECHOCARDIOGRAM REPORT   Patient Name:   Allen Terry Date of Exam: 12/25/2020 Medical Rec  #:  [id number]      Height:       68.0 in Accession #:    [id number]     Weight:       194.0 lb Date of Birth:  10/14/1936      BSA:          2.017 m Patient Age:    84 years       BP:           133/71 mmHg Patient Gender: M              HR:           74 bpm. Exam Location:  Inpatient Procedure: 2D Echo, Cardiac Doppler and Color Doppler Indications:    R07.9* Chest pain, unspecified  History:        Patient has no prior history of Echocardiogram examinations.                 CAD; Risk Factors:Hypertension and Dyslipidemia.  Sonographer:    Tiffany Dance Referring Phys: [id number] CHING T TU IMPRESSIONS  1. Left ventricular ejection fraction, by estimation, is 55 to 60%. The left ventricle has normal function. The left ventricle has no regional wall motion abnormalities. There is mild concentric left ventricular hypertrophy. Left ventricular diastolic function could not be evaluated.  2. Right ventricular systolic function is normal. The right ventricular size is normal. There is normal pulmonary artery systolic pressure.  3. Left atrial size was mildly dilated.  4. The mitral valve is normal in structure. Mild mitral valve regurgitation. No evidence of mitral stenosis. Moderate mitral annular calcification.  5. The aortic valve is normal in structure. Aortic valve regurgitation is trivial. Mild to moderate aortic valve sclerosis/calcification is present, without any evidence of aortic stenosis.  6. The inferior vena cava is normal in size with greater than 50% respiratory variability, suggesting right atrial pressure of 3 mmHg. FINDINGS  Left Ventricle: Left ventricular ejection fraction, by estimation, is 55 to 60%. The left ventricle has normal function. The left ventricle has no regional wall motion abnormalities. The left ventricular internal cavity size was normal in size. There is  mild concentric left ventricular hypertrophy. Left ventricular diastolic function could not be evaluated due to atrial fibrillation.  Left ventricular diastolic function could not be evaluated. Right Ventricle: The right ventricular size is normal. No increase in right ventricular wall thickness. Right ventricular systolic function is normal. There is normal pulmonary artery systolic pressure. The tricuspid regurgitant velocity is 2.40 m/s, and  with an assumed right atrial pressure of 3 mmHg, the estimated right ventricular systolic pressure is 26.0 mmHg. Left Atrium: Left atrial size was mildly dilated. Right Atrium: Right atrial size was normal in size. Pericardium: There is no evidence of pericardial effusion. Mitral Valve: The mitral valve is normal in structure. There is mild thickening of the mitral valve leaflet(s). There is mild calcification of the mitral valve leaflet(s). Moderate mitral annular calcification. Mild mitral valve regurgitation. No evidence of mitral valve stenosis.  Tricuspid Valve: The tricuspid valve is normal in structure. Tricuspid valve regurgitation is mild . No evidence of tricuspid stenosis. Aortic Valve: The aortic valve is normal in structure. Aortic valve regurgitation is trivial. Mild to moderate aortic valve sclerosis/calcification is present, without any evidence of aortic stenosis. Pulmonic Valve: The pulmonic valve was normal in structure. Pulmonic valve regurgitation is not visualized. No evidence of pulmonic stenosis. Aorta: The aortic root is normal in size and structure. Venous: The inferior vena cava is normal in size with greater than 50% respiratory variability, suggesting right atrial pressure of 3 mmHg. IAS/Shunts: No atrial level shunt detected by color flow Doppler. Additional Comments: A is visualized in the right atrium and right ventricle.  LEFT VENTRICLE PLAX 2D LVIDd:         5.30 cm LVIDs:         3.20 cm LV PW:         1.10 cm LV IVS:        1.00 cm LVOT diam:     2.20 cm LV SV:         71 LV SV Index:   35 LVOT Area:     3.80 cm  RIGHT VENTRICLE          IVC RV Basal diam:  2.80 cm  IVC  diam: 2.00 cm TAPSE (M-mode): 2.1 cm LEFT ATRIUM             Index       RIGHT ATRIUM           Index LA diam:        4.20 cm 2.08 cm/m  RA Area:     17.10 cm LA Vol (A2C):   82.9 ml 41.09 ml/m RA Volume:   44.30 ml  21.96 ml/m LA Vol (A4C):   48.1 ml 23.84 ml/m LA Biplane Vol: 64.8 ml 32.12 ml/m  AORTIC VALVE LVOT Vmax:   98.95 cm/s LVOT Vmean:  66.900 cm/s LVOT VTI:    0.186 m  AORTA Ao Root diam: 4.10 cm Ao Asc diam:  3.90 cm MITRAL VALVE                TRICUSPID VALVE MV Area (PHT): 3.21 cm     TR Peak grad:   23.0 mmHg MV Decel Time: 236 msec     TR Vmax:        240.00 cm/s MV E velocity: 106.00 cm/s                             SHUNTS                             Systemic VTI:  0.19 m                             Systemic Diam: 2.20 cm Tobias AlexanderKatarina Nelson MD Electronically signed by Tobias AlexanderKatarina Nelson MD Signature Date/Time: 12/25/2020/10:48:35 AM    Final      Medical Consultants:    None.   Subjective:    Allen Terry denies any chest pain.  Objective:    Vitals:   12/25/20 1700 12/25/20 2030 12/26/20 0107 12/26/20 0616  BP: (!) 118/58 (!) 111/57 111/77 (!) 109/56  Pulse: 75 78  (!) 46  Resp: 16 17    Temp:  98 F (36.7 C) 98.4 F (  36.9 C) 97.9 F (36.6 C)  TempSrc:  Oral Oral Oral  SpO2: 100% 100% 97% 97%  Weight:    90.7 kg  Height:       SpO2: 97 %   Intake/Output Summary (Last 24 hours) at 12/26/2020 0759 Last data filed at 12/26/2020 1610 Gross per 24 hour  Intake 1140 ml  Output 150 ml  Net 990 ml   Filed Weights   12/24/20 2156 12/26/20 0616  Weight: 88 kg 90.7 kg    Exam: General exam: In no acute distress. Respiratory system: Good air movement and clear to auscultation. Cardiovascular system: S1 & S2 heard, RRR. No JVD. Gastrointestinal system: Abdomen is nondistended, soft and nontender.  Extremities: No pedal edema. Skin: No rashes, lesions or ulcers Psychiatry: Judgement and insight appear normal. Mood & affect appropriate.   Data Reviewed:     Labs: Basic Metabolic Panel: Recent Labs  Lab 12/24/20 1652 12/25/20 0438  NA 135 135  K 2.8* 2.9*  CL 97* 98  CO2 25 26  GLUCOSE 164* 119*  BUN 8 11  CREATININE 1.05 1.13  CALCIUM 9.0 8.5*   GFR Estimated Creatinine Clearance: 55.1 mL/min (by C-G formula based on SCr of 1.13 mg/dL). Liver Function Tests: No results for input(s): AST, ALT, ALKPHOS, BILITOT, PROT, ALBUMIN in the last 168 hours. No results for input(s): LIPASE, AMYLASE in the last 168 hours. No results for input(s): AMMONIA in the last 168 hours. Coagulation profile No results for input(s): INR, PROTIME in the last 168 hours. COVID-19 Labs  No results for input(s): DDIMER, FERRITIN, LDH, CRP in the last 72 hours.  Lab Results  Component Value Date   SARSCOV2NAA NEGATIVE 12/24/2020   SARSCOV2NAA NEGATIVE 04/07/2019    CBC: Recent Labs  Lab 12/24/20 1652 12/25/20 0438  WBC 13.3* 13.5*  HGB 9.3* 8.1*  HCT 34.0* 29.0*  MCV 67.2* 66.1*  PLT 585* 534*   Cardiac Enzymes: No results for input(s): CKTOTAL, CKMB, CKMBINDEX, TROPONINI in the last 168 hours. BNP (last 3 results) No results for input(s): PROBNP in the last 8760 hours. CBG: No results for input(s): GLUCAP in the last 168 hours. D-Dimer: No results for input(s): DDIMER in the last 72 hours. Hgb A1c: Recent Labs    12/24/20 1652 12/25/20 0438  HGBA1C 6.5* 6.5*   Lipid Profile: Recent Labs    12/24/20 2005  CHOL 141  HDL 22*  LDLCALC 83  TRIG 960*  CHOLHDL 6.4   Thyroid function studies: Recent Labs    12/24/20 2255  TSH 2.766   Anemia work up: Recent Labs    12/25/20 0438  TIBC 490*  IRON 14*   Sepsis Labs: Recent Labs  Lab 12/24/20 1652 12/25/20 0438  WBC 13.3* 13.5*   Microbiology Recent Results (from the past 240 hour(s))  SARS CORONAVIRUS 2 (TAT 6-24 HRS) Nasopharyngeal Nasopharyngeal Swab     Status: None   Collection Time: 12/24/20  9:01 PM   Specimen: Nasopharyngeal Swab  Result Value Ref Range  Status   SARS Coronavirus 2 NEGATIVE NEGATIVE Final    Comment: (NOTE) SARS-CoV-2 target nucleic acids are NOT DETECTED.  The SARS-CoV-2 RNA is generally detectable in upper and lower respiratory specimens during the acute phase of infection. Negative results do not preclude SARS-CoV-2 infection, do not rule out co-infections with other pathogens, and should not be used as the sole basis for treatment or other patient management decisions. Negative results must be combined with clinical observations, patient history, and epidemiological information. The  expected result is Negative.  Fact Sheet for Patients: HairSlick.no  Fact Sheet for Healthcare Providers: quierodirigir.com  This test is not yet approved or cleared by the [place] FDA and  has been authorized for detection and/or diagnosis of SARS-CoV-2 by FDA under an Emergency Use Authorization (EUA). This EUA will remain  in effect (meaning this test can be used) for the duration of the COVID-19 declaration under Se ction 564(b)(1) of the Act, 21 U.S.C. section 360bbb-3(b)(1), unless the authorization is terminated or revoked sooner.  Performed at [facility], [address], [place], [place] [address]      Medications:   . aspirin EC  81 mg Oral Daily  . carvedilol  6.25 mg Oral BID WC  . diltiazem  120 mg Oral Daily  . furosemide  20 mg Oral Daily  . isosorbide mononitrate  30 mg Oral Daily  . pantoprazole  40 mg Oral Daily   Continuous Infusions:     LOS: 0 days   Marinda Elk  [facility]  12/26/2020, 7:59 AM

## 2020-12-26 NOTE — Plan of Care (Signed)

## 2020-12-26 NOTE — Telephone Encounter (Signed)
Patient doesn't feel like scheduling at this time and requests a call back.

## 2021-01-16 NOTE — Telephone Encounter (Signed)
LMOV  

## 2021-02-06 NOTE — Telephone Encounter (Signed)
Attempted to schedule.  LMOV to call office.  ° °

## 2022-04-15 ENCOUNTER — Inpatient Hospital Stay
Admission: EM | Admit: 2022-04-15 | Discharge: 2022-04-17 | DRG: 871 | Discharge status: Against Medical Advice | Payer: Medicare Other | Attending: Internal Medicine | Admitting: Internal Medicine

## 2022-04-15 ENCOUNTER — Other Ambulatory Visit: Payer: Self-pay

## 2022-04-15 ENCOUNTER — Emergency Department: Payer: Medicare Other

## 2022-04-15 DIAGNOSIS — R7303 Prediabetes: Secondary | ICD-10-CM | POA: Diagnosis present

## 2022-04-15 DIAGNOSIS — Z961 Presence of intraocular lens: Secondary | ICD-10-CM | POA: Diagnosis present

## 2022-04-15 DIAGNOSIS — R0602 Shortness of breath: Secondary | ICD-10-CM | POA: Diagnosis present

## 2022-04-15 DIAGNOSIS — E785 Hyperlipidemia, unspecified: Secondary | ICD-10-CM | POA: Diagnosis present

## 2022-04-15 DIAGNOSIS — K219 Gastro-esophageal reflux disease without esophagitis: Secondary | ICD-10-CM | POA: Diagnosis present

## 2022-04-15 DIAGNOSIS — Z5329 Procedure and treatment not carried out because of patient's decision for other reasons: Secondary | ICD-10-CM | POA: Diagnosis present

## 2022-04-15 DIAGNOSIS — Z880 Allergy status to penicillin: Secondary | ICD-10-CM | POA: Diagnosis not present

## 2022-04-15 DIAGNOSIS — Z8249 Family history of ischemic heart disease and other diseases of the circulatory system: Secondary | ICD-10-CM | POA: Diagnosis not present

## 2022-04-15 DIAGNOSIS — Z9841 Cataract extraction status, right eye: Secondary | ICD-10-CM

## 2022-04-15 DIAGNOSIS — D75839 Thrombocytosis, unspecified: Secondary | ICD-10-CM | POA: Diagnosis present

## 2022-04-15 DIAGNOSIS — I10 Essential (primary) hypertension: Secondary | ICD-10-CM | POA: Diagnosis not present

## 2022-04-15 DIAGNOSIS — Z20822 Contact with and (suspected) exposure to covid-19: Secondary | ICD-10-CM | POA: Diagnosis present

## 2022-04-15 DIAGNOSIS — A419 Sepsis, unspecified organism: Secondary | ICD-10-CM | POA: Diagnosis not present

## 2022-04-15 DIAGNOSIS — Z888 Allergy status to other drugs, medicaments and biological substances status: Secondary | ICD-10-CM | POA: Diagnosis not present

## 2022-04-15 DIAGNOSIS — Z6830 Body mass index (BMI) 30.0-30.9, adult: Secondary | ICD-10-CM | POA: Diagnosis not present

## 2022-04-15 DIAGNOSIS — Z87891 Personal history of nicotine dependence: Secondary | ICD-10-CM

## 2022-04-15 DIAGNOSIS — Z9842 Cataract extraction status, left eye: Secondary | ICD-10-CM | POA: Diagnosis not present

## 2022-04-15 DIAGNOSIS — Z955 Presence of coronary angioplasty implant and graft: Secondary | ICD-10-CM | POA: Diagnosis not present

## 2022-04-15 DIAGNOSIS — Z79899 Other long term (current) drug therapy: Secondary | ICD-10-CM

## 2022-04-15 DIAGNOSIS — E669 Obesity, unspecified: Secondary | ICD-10-CM | POA: Diagnosis present

## 2022-04-15 DIAGNOSIS — J189 Pneumonia, unspecified organism: Secondary | ICD-10-CM

## 2022-04-15 DIAGNOSIS — I2510 Atherosclerotic heart disease of native coronary artery without angina pectoris: Secondary | ICD-10-CM | POA: Diagnosis present

## 2022-04-15 DIAGNOSIS — I4891 Unspecified atrial fibrillation: Secondary | ICD-10-CM

## 2022-04-15 DIAGNOSIS — I480 Paroxysmal atrial fibrillation: Secondary | ICD-10-CM | POA: Diagnosis present

## 2022-04-15 DIAGNOSIS — I509 Heart failure, unspecified: Secondary | ICD-10-CM

## 2022-04-15 DIAGNOSIS — Z823 Family history of stroke: Secondary | ICD-10-CM

## 2022-04-15 DIAGNOSIS — Z7982 Long term (current) use of aspirin: Secondary | ICD-10-CM | POA: Diagnosis not present

## 2022-04-15 LAB — COMPREHENSIVE METABOLIC PANEL
ALT: 14 U/L (ref 0–44)
AST: 47 U/L — ABNORMAL HIGH (ref 15–41)
Albumin: 3.8 g/dL (ref 3.5–5.0)
Alkaline Phosphatase: 104 U/L (ref 38–126)
Anion gap: 13 (ref 5–15)
BUN: 9 mg/dL (ref 8–23)
CO2: 21 mmol/L — ABNORMAL LOW (ref 22–32)
Calcium: 8.9 mg/dL (ref 8.9–10.3)
Chloride: 100 mmol/L (ref 98–111)
Creatinine, Ser: 0.93 mg/dL (ref 0.61–1.24)
GFR, Estimated: >60 mL/min (ref >60)
Glucose, Bld: 135 mg/dL — ABNORMAL HIGH (ref 70–99)
Potassium: 4.1 mmol/L (ref 3.5–5.1)
Sodium: 134 mmol/L — ABNORMAL LOW (ref 135–145)
Total Bilirubin: 1.8 mg/dL — ABNORMAL HIGH (ref 0.3–1.2)
Total Protein: 7.2 g/dL (ref 6.5–8.1)

## 2022-04-15 LAB — CBC
HCT: 42.1 % (ref 39.0–52.0)
Hemoglobin: 13.4 g/dL (ref 13.0–17.0)
MCH: 27.8 pg (ref 26.0–34.0)
MCHC: 31.8 g/dL (ref 30.0–36.0)
MCV: 87.3 fL (ref 80.0–100.0)
Platelets: 405 10*3/uL — ABNORMAL HIGH (ref 150–400)
RBC: 4.82 MIL/uL (ref 4.22–5.81)
RDW: 14 % (ref 11.5–15.5)
WBC: 18.3 10*3/uL — ABNORMAL HIGH (ref 4.0–10.5)
nRBC: 0 % (ref 0.0–0.2)

## 2022-04-15 LAB — TSH: TSH: 2.332 u[IU]/mL (ref 0.350–4.500)

## 2022-04-15 LAB — STREP PNEUMONIAE URINARY ANTIGEN: Strep Pneumo Urinary Antigen: NEGATIVE

## 2022-04-15 LAB — SARS CORONAVIRUS 2 BY RT PCR: SARS Coronavirus 2 by RT PCR: NEGATIVE

## 2022-04-15 LAB — LACTIC ACID, PLASMA: Lactic Acid, Venous: 1.2 mmol/L (ref 0.5–1.9)

## 2022-04-15 LAB — T4, FREE: Free T4: 1.28 ng/dL — ABNORMAL HIGH (ref 0.61–1.12)

## 2022-04-15 LAB — BRAIN NATRIURETIC PEPTIDE: B Natriuretic Peptide: 82.2 pg/mL (ref 0.0–100.0)

## 2022-04-15 LAB — TROPONIN I (HIGH SENSITIVITY)
Troponin I (High Sensitivity): 13 ng/L (ref <18)
Troponin I (High Sensitivity): 10 ng/L (ref <18)

## 2022-04-15 LAB — PROCALCITONIN: Procalcitonin: <0.1 ng/mL

## 2022-04-15 LAB — MAGNESIUM: Magnesium: 2.5 mg/dL — ABNORMAL HIGH (ref 1.7–2.4)

## 2022-04-15 MED ORDER — DILTIAZEM HCL-DEXTROSE 125-5 MG/125ML-% IV SOLN (PREMIX)
5.0000 mg/h | INTRAVENOUS | Status: DC
  Administered 2022-04-15: 5 mg/h via INTRAVENOUS
  Administered 2022-04-16 (×2): 7.5 mg/h via INTRAVENOUS
  Filled 2022-04-15 (×4): qty 125

## 2022-04-15 MED ORDER — DILTIAZEM HCL ER COATED BEADS 120 MG PO CP24
120.0000 mg | ORAL_CAPSULE | Freq: Once | ORAL | Status: AC
  Administered 2022-04-15: 120 mg via ORAL
  Filled 2022-04-15: qty 1

## 2022-04-15 MED ORDER — SODIUM CHLORIDE 0.9 % IV SOLN
500.0000 mg | INTRAVENOUS | Status: DC
  Administered 2022-04-15 – 2022-04-16 (×2): 500 mg via INTRAVENOUS
  Filled 2022-04-15 (×4): qty 5

## 2022-04-15 MED ORDER — ALBUTEROL SULFATE (2.5 MG/3ML) 0.083% IN NEBU
2.5000 mg | INHALATION_SOLUTION | RESPIRATORY_TRACT | Status: DC | PRN
  Administered 2022-04-15 – 2022-04-16 (×4): 2.5 mg via RESPIRATORY_TRACT
  Filled 2022-04-15 (×4): qty 3

## 2022-04-15 MED ORDER — ASPIRIN 81 MG PO TBEC
81.0000 mg | DELAYED_RELEASE_TABLET | Freq: Every day | ORAL | Status: DC
  Administered 2022-04-15 – 2022-04-16 (×2): 81 mg via ORAL
  Filled 2022-04-15 (×2): qty 1

## 2022-04-15 MED ORDER — HYDROCOD POLI-CHLORPHE POLI ER 10-8 MG/5ML PO SUER
5.0000 mL | Freq: Once | ORAL | Status: AC
  Administered 2022-04-15: 5 mL via ORAL
  Filled 2022-04-15: qty 5

## 2022-04-15 MED ORDER — NITROGLYCERIN 0.4 MG SL SUBL: 0.4000 mg | SUBLINGUAL_TABLET | SUBLINGUAL | Status: DC | PRN

## 2022-04-15 MED ORDER — SODIUM CHLORIDE 0.9 % IV BOLUS
1000.0000 mL | Freq: Once | INTRAVENOUS | Status: AC
  Administered 2022-04-15: 1000 mL via INTRAVENOUS

## 2022-04-15 MED ORDER — ACETAMINOPHEN 325 MG PO TABS: 650.0000 mg | ORAL_TABLET | Freq: Four times a day (QID) | ORAL | Status: DC | PRN

## 2022-04-15 MED ORDER — DILTIAZEM HCL 25 MG/5ML IV SOLN
15.0000 mg | Freq: Once | INTRAVENOUS | Status: AC
Start: 2022-04-15 — End: 2022-04-15
  Administered 2022-04-15: 15 mg via INTRAVENOUS
  Filled 2022-04-15: qty 5

## 2022-04-15 MED ORDER — HYDRALAZINE HCL 20 MG/ML IJ SOLN
5.0000 mg | INTRAMUSCULAR | Status: DC | PRN
Start: 2022-04-15 — End: 2022-04-17

## 2022-04-15 MED ORDER — DM-GUAIFENESIN ER 30-600 MG PO TB12: 1.0000 | ORAL_TABLET | Freq: Two times a day (BID) | ORAL | Status: DC | PRN

## 2022-04-15 MED ORDER — TAMSULOSIN HCL 0.4 MG PO CAPS
0.4000 mg | ORAL_CAPSULE | Freq: Every evening | ORAL | Status: DC
  Administered 2022-04-15 – 2022-04-16 (×2): 0.4 mg via ORAL
  Filled 2022-04-15 (×2): qty 1

## 2022-04-15 MED ORDER — ENOXAPARIN SODIUM 60 MG/0.6ML IJ SOSY
0.5000 mg/kg | PREFILLED_SYRINGE | INTRAMUSCULAR | Status: DC
  Filled 2022-04-15: qty 0.6

## 2022-04-15 MED ORDER — LACTATED RINGERS IV SOLN: INTRAVENOUS | Status: DC

## 2022-04-15 MED ORDER — CARVEDILOL 6.25 MG PO TABS
6.2500 mg | ORAL_TABLET | Freq: Two times a day (BID) | ORAL | Status: DC
  Administered 2022-04-15 – 2022-04-16 (×3): 6.25 mg via ORAL
  Filled 2022-04-15 (×3): qty 1

## 2022-04-15 MED ORDER — CARVEDILOL 6.25 MG PO TABS
6.2500 mg | ORAL_TABLET | Freq: Once | ORAL | Status: AC
  Administered 2022-04-15: 6.25 mg via ORAL
  Filled 2022-04-15: qty 1

## 2022-04-15 MED ORDER — FUROSEMIDE 10 MG/ML IJ SOLN
20.0000 mg | Freq: Once | INTRAMUSCULAR | Status: AC
  Administered 2022-04-15: 20 mg via INTRAVENOUS
  Filled 2022-04-15: qty 4

## 2022-04-15 MED ORDER — ONDANSETRON HCL 4 MG/2ML IJ SOLN: 4.0000 mg | Freq: Three times a day (TID) | INTRAMUSCULAR | Status: DC | PRN

## 2022-04-15 MED ORDER — DIPHENHYDRAMINE HCL 50 MG/ML IJ SOLN: 12.5000 mg | Freq: Three times a day (TID) | INTRAMUSCULAR | Status: DC | PRN

## 2022-04-15 MED ORDER — PANTOPRAZOLE SODIUM 40 MG PO TBEC
40.0000 mg | DELAYED_RELEASE_TABLET | Freq: Every day | ORAL | Status: DC
  Administered 2022-04-15 – 2022-04-16 (×2): 40 mg via ORAL
  Filled 2022-04-15 (×2): qty 1

## 2022-04-15 MED ORDER — SODIUM CHLORIDE 0.9 % IV SOLN
2.0000 g | INTRAVENOUS | Status: DC
  Administered 2022-04-15 – 2022-04-16 (×2): 2 g via INTRAVENOUS
  Filled 2022-04-15 (×3): qty 20

## 2022-04-15 NOTE — Progress Notes (Signed)
Elink following code sepsis °

## 2022-04-15 NOTE — Assessment & Plan Note (Addendum)
No CP -Continue aspirin -hold Imdur due to softer blood pressure -As needed nitroglycerin

## 2022-04-15 NOTE — Assessment & Plan Note (Signed)
  BMI= 30.41   and BW= 90.7 -Diet and exercise.   -Encouraged to lose weight.

## 2022-04-15 NOTE — ED Notes (Signed)
Pt assisted from recliner to bed. Leads in place, call bell, cane, remote, urinals within reach.

## 2022-04-15 NOTE — Assessment & Plan Note (Signed)
-  see above 

## 2022-04-15 NOTE — ED Notes (Signed)
Pt with increase in hr to 130-140 while on ekg machine. Pt with history of a fib.

## 2022-04-15 NOTE — Assessment & Plan Note (Signed)
HR upto 140s --> 80s after started Cardizem gtt. Pt is not taking AC, possibly due to history of GI bleeding -continue cardizem gtt -hold oral cardizem -continue coreg

## 2022-04-15 NOTE — Progress Notes (Signed)
CODE SEPSIS - PHARMACY COMMUNICATION  **Broad Spectrum Antibiotics should be administered within 1 hour of Sepsis diagnosis**  Time Code Sepsis Called/Page Received: 2957  Antibiotics Ordered: ceftriaxone + azithromycin  Time of 1st antibiotic administration: 0921  Additional action taken by pharmacy: none  If necessary, Name of Provider/Nurse Contacted: n/a   Jaynie Bream, PharmD Pharmacy Resident  04/15/2022 8:57 AM

## 2022-04-15 NOTE — Assessment & Plan Note (Addendum)
Blood pressures are soft. - Patient is on Cardizem drip -Continue Coreg -Hold HCTZ, Imdur and oral Cardizem -hold lasix (pt received 20 mg of Lasix in the ED)

## 2022-04-15 NOTE — ED Provider Notes (Signed)
Patient received in signout from Dr. Dolores Frame pending CT scan of the chest and reassessment.  Patient presents with dyspnea and cough.  He has evidence of pneumonia on the CT scan of his chest and remains in A-fib with RVR despite IV and oral diltiazem.  He is meeting sepsis criteria so we will initiate CAP coverage and a diltiazem drip.  We will consult with medicine for admission.  Clinical Course as of 04/15/22 0850  Mon Apr 15, 2022  0449 HR 90s-100s, nonproductive cough noted.  We will check COVID and administer Tussionex. [JS]  0540 X-ray concerning for irregularity versus small pneumonia; will obtain CT scan. [JS]  0609 Lactic acid negative [JS]  0656 Patient back from CT scan, heart rate noted 130s.  Will redose IV Cardizem.  If heart rate is able to be controlled, patient is pending COVID-19 swab and repeat troponin.  Would ambulate patient to ensure heart rate stability and lack of hypoxia and in that event, can be safely discharged home.  Otherwise, would admit patient for atrial fibrillation with RVR on diltiazem drip.  Care transferred to Dr. Katrinka Blazing at change of shift. [JS]  0846 Reassessed.  Patient has received another dose of IV diltiazem and also his morning dose of carvedilol and diltiazem.  He remains tachycardic with rates 110s-130s.  I reassessed him and he reports feeling "rough" we discussed plan of care and agree upon starting diltiazem drip, antibiotics and admission to the hospital. [DS]    Clinical Course User Index [DS] Delton Prairie, MD [JS] Irean Hong, MD     .Critical Care  Performed by: Delton Prairie, MD Authorized by: Delton Prairie, MD   Critical care provider statement:    Critical care time (minutes):  30   Critical care time was exclusive of:  Separately billable procedures and treating other patients   Critical care was necessary to treat or prevent imminent or life-threatening deterioration of the following conditions:  Circulatory failure, cardiac failure and  sepsis   Critical care was time spent personally by me on the following activities:  Development of treatment plan with patient or surrogate, discussions with consultants, evaluation of patient's response to treatment, examination of patient, ordering and review of laboratory studies, ordering and review of radiographic studies, ordering and performing treatments and interventions, pulse oximetry, re-evaluation of patient's condition and review of old charts     Delton Prairie, MD 04/15/22 [phone]

## 2022-04-15 NOTE — H&P (Addendum)
History and Physical    ABDULAHI SCHOR RCB:[id number] DOB: Jan 07, 1936 DOA: 04/15/2022  Referring MD/NP/PA:   PCP: Pcp, No   Patient coming from:  The patient is coming from home.  At baseline, pt is independent for most of ADL.        Chief Complaint: Cough, shortness of breath  HPI: Allen Terry is a 86 y.o. male with medical history significant of hypertension, hyperlipidemia, GERD, CAD, s/p of DES, lower GI bleeding, atrial fibrillation not on anticoagulants, obesity with BMI 30.41, who presents with cough and shortness breath.  Patient states that he has been having cough and shortness breath for several days, which has been progressively worsening.  He coughs up dark-colored sputum.  Denies chest pain, fever or chills.  Patient states that he does not have nausea vomiting, diarrhea or abdominal pain at rest, but the coughing induces some abdominal wall pain.  No symptoms of UTI.    Patient was found to have atrial fibrillation with RVR, heart rate up to 140s, Cardizem drip was started in the emergency room.  Data Reviewed and ED Course: pt was found to have WBC 18.3, lactic acid 1.2, troponin level 13, 10, BNP 82, negative COVID PCR, GFR> 60, temperature normal, blood pressure soft 96/72, 33, oxygen saturation 95% on room air.  CT of the chest that showed infiltration in the right upper lobe and right lower lobe.  Patient is admitted to PCU as inpatient.   EKG: I have personally reviewed.  Atrial fibrillation, QTc 506, heart rate 143, right bundle blockade, early R wave progression.   Review of Systems:   General: no fevers, chills, no body weight gain, has fatigue HEENT: no blurry vision, hearing changes or sore throat Respiratory: has dyspnea, coughing, no wheezing CV: no chest pain, no palpitations GI: no nausea, vomiting, has abdominal wall pain, no diarrhea, constipation GU: no dysuria, burning on urination, increased urinary frequency, hematuria  Ext: has leg  edema Neuro: no unilateral weakness, numbness, or tingling, no vision change or hearing loss Skin: no rash, no skin tear. MSK: No muscle spasm, no deformity, no limitation of range of movement in spin Heme: No easy bruising.  Travel history: No recent long distant travel.   Allergy:  Allergies  Allergen Reactions   Ferrous Sulfate Other (See Comments)    Face burning   Meclizine Other (See Comments)    Burning in face when taking iron   Metoprolol Other (See Comments)    abd pain   Pravastatin Other (See Comments)    Myalgias, even when combined with coenzyme q 10   Ticagrelor Other (See Comments)    Cough, sneezing and nasal congestion   Lisinopril Cough   Penicillins Hives and Rash    Did it involve swelling of the face/tongue/throat, SOB, or low BP? No Did it involve sudden or severe rash/hives, skin peeling, or any reaction on the inside of your mouth or nose? No Did you need to seek medical attention at a hospital or doctor's office? Yes When did it last happen?   40 years    If all above answers are "NO", may proceed with cephalosporin use.    Past Medical History:  Diagnosis Date   Acute lower GI bleeding 2015   "related to blood thinners"   Anemia    Bursitis of both shoulders    CAD (coronary artery disease)    a. s/p PCI 01/17/2011 85% proximal RCA with DES. b. PCI 09/05/2014 60% mid LAD lesion,  with DES  b. Cath 02/13/2016 80% ost OM2 treated with 2.7514 mm Resolute DES. d. Cath 12/2018 s/p DES to prox-mid LAD with residual 70% diagonal disease for medical therapy, EF 55-65%.   HTN (hypertension)    "not anymore" (02/12/2016)   Hyperlipidemia    Noncompliance    Pneumonia    "double"   Pre-diabetes    Tuberculosis    "I've got 25% somewhere in my body" (02/12/2016)    Past Surgical History:  Procedure Laterality Date   CARDIAC CATHETERIZATION N/A 02/13/2016   Procedure: Left Heart Cath and Coronary Angiography;  Surgeon: Lennette Bihari, MD;  Location: [facility]  INVASIVE CV LAB;  Service: Cardiovascular;  Laterality: N/A;   CARDIAC CATHETERIZATION N/A 02/13/2016   Procedure: Coronary Stent Intervention;  Surgeon: Lennette Bihari, MD;  Location: [facility] INVASIVE CV LAB;  Service: Cardiovascular;  Laterality: N/A;   CATARACT EXTRACTION W/ INTRAOCULAR LENS  IMPLANT, BILATERAL Bilateral    CORONARY ANGIOPLASTY WITH STENT PLACEMENT     s/p PCI 01/17/2011 85% proximal RCA with DES, PCI 09/05/2014 60% mid LAD lesion, with DES    CORONARY STENT INTERVENTION N/A 01/01/2019   Procedure: CORONARY STENT INTERVENTION;  Surgeon: Corky Crafts, MD;  Location: [facility] INVASIVE CV LAB;  Service: Cardiovascular;  Laterality: N/A;   LEFT HEART CATH AND CORONARY ANGIOGRAPHY N/A 01/01/2019   Procedure: LEFT HEART CATH AND CORONARY ANGIOGRAPHY;  Surgeon: Corky Crafts, MD;  Location: [facility] INVASIVE CV LAB;  Service: Cardiovascular;  Laterality: N/A;   TONSILLECTOMY     UPPER GI ENDOSCOPY     s/p GI bleed related to Brilenta    Social History:  reports that he has quit smoking. His smoking use included cigarettes. He has never used smokeless tobacco. He reports current alcohol use. He reports that he does not use drugs.  Family History:  Family History  Problem Relation Age of Onset   Arrhythmia Mother    Arrhythmia Sister    Stroke Father    Hypertension Brother      Prior to Admission medications   Medication Sig Start Date End Date Taking? Authorizing Provider  acetaminophen (TYLENOL) 500 MG tablet Take 1,000 mg by mouth every 6 (six) hours as needed for mild pain.    [provider]  aspirin EC 81 MG tablet Take 81 mg by mouth daily.     [provider]  carvedilol (COREG) 6.25 MG tablet Take 1 tablet (6.25 mg total) by mouth 2 (two) times daily with a meal. 12/26/20   Marinda Elk, MD  clobetasol cream (TEMOVATE) 0.05 % Apply 1 application topically 2 (two) times daily. 12/18/20   [provider]  diclofenac Sodium (VOLTAREN) 1 % GEL  Apply 1 application topically 4 (four) times daily. 10/31/20   [provider]  diltiazem (DILACOR XR) 120 MG 24 hr capsule Take 120 mg by mouth daily.    [provider]  fluticasone (FLONASE) 50 MCG/ACT nasal spray Place 1 spray into both nostrils 2 (two) times daily as needed for allergies. 11/20/20   [provider]  hydrochlorothiazide (HYDRODIURIL) 25 MG tablet Take 25 mg by mouth daily. 10/27/20   [provider]  isosorbide mononitrate (IMDUR) 30 MG 24 hr tablet Take 1 tablet (30 mg total) by mouth daily. 12/26/20   Marinda Elk, MD  nitroGLYCERIN (NITROSTAT) 0.4 MG SL tablet Place 1 tablet (0.4 mg total) under the tongue every 5 (five) minutes x 3 doses as needed for chest pain. 02/14/16  Azalee Course, PA  pantoprazole (PROTONIX) 40 MG tablet Take 40 mg by mouth daily.    [provider]  potassium chloride SA (KLOR-CON) 20 MEQ tablet Take 20 mEq by mouth daily. 10/23/20   [provider]  tamsulosin (FLOMAX) 0.4 MG CAPS capsule Take 0.4 mg by mouth daily. 10/28/20   [provider]    Physical Exam: Vitals:   04/15/22 1000 04/15/22 1011 04/15/22 1030 04/15/22 1100  BP: 119/78 119/78 117/67 96/72  Pulse: 81  (!) 112 82  Resp: 17 18    Temp:      TempSrc:      SpO2: 97%  96% 95%  Weight:      Height:       General: Not in acute distress HEENT:       Eyes: PERRL, EOMI, no scleral icterus.       ENT: No discharge from the ears and nose, no pharynx injection, no tonsillar enlargement.        Neck: No JVD, no bruit, no mass felt. Heme: No neck lymph node enlargement. Cardiac: S1/S2, RRR, No murmurs, No gallops or rubs. Respiratory: has coarse breathing sound bilaterally GI: Soft, nondistended, nontender, no rebound pain, no organomegaly, BS present. GU: No hematuria Ext: has 1+ pitting leg edema bilaterally. 1+DP/PT pulse bilaterally. Musculoskeletal: No joint deformities, No joint redness or warmth, no limitation of  ROM in spin. Skin: No rashes.  Neuro: Alert, oriented X3, cranial nerves II-XII grossly intact, moves all extremities normally.  Psych: Patient is not psychotic, no suicidal or hemocidal ideation.  Labs on Admission: I have personally reviewed following labs and imaging studies  CBC: Recent Labs  Lab 04/15/22 0402  WBC 18.3*  HGB 13.4  HCT 42.1  MCV 87.3  PLT 405*   Basic Metabolic Panel: Recent Labs  Lab 04/15/22 0402 04/15/22 0739  NA 134*  --   K 4.1  --   CL 100  --   CO2 21*  --   GLUCOSE 135*  --   BUN 9  --   CREATININE 0.93  --   CALCIUM 8.9  --   MG  --  2.5*   GFR: Estimated Creatinine Clearance: 65.8 mL/min (by C-G formula based on SCr of 0.93 mg/dL). Liver Function Tests: Recent Labs  Lab 04/15/22 0402  AST 47*  ALT 14  ALKPHOS 104  BILITOT 1.8*  PROT 7.2  ALBUMIN 3.8   No results for input(s): "LIPASE", "AMYLASE" in the last 168 hours. No results for input(s): "AMMONIA" in the last 168 hours. Coagulation Profile: No results for input(s): "INR", "PROTIME" in the last 168 hours. Cardiac Enzymes: No results for input(s): "CKTOTAL", "CKMB", "CKMBINDEX", "TROPONINI" in the last 168 hours. BNP (last 3 results) No results for input(s): "PROBNP" in the last 8760 hours. HbA1C: No results for input(s): "HGBA1C" in the last 72 hours. CBG: No results for input(s): "GLUCAP" in the last 168 hours. Lipid Profile: No results for input(s): "CHOL", "HDL", "LDLCALC", "TRIG", "CHOLHDL", "LDLDIRECT" in the last 72 hours. Thyroid Function Tests: Recent Labs    04/15/22 0402  TSH 2.332  FREET4 1.28*   Anemia Panel: No results for input(s): "VITAMINB12", "FOLATE", "FERRITIN", "TIBC", "IRON", "RETICCTPCT" in the last 72 hours. Urine analysis: No results found for: "COLORURINE", "APPEARANCEUR", "LABSPEC", "PHURINE", "GLUCOSEU", "HGBUR", "BILIRUBINUR", "KETONESUR", "PROTEINUR", "UROBILINOGEN", "NITRITE", "LEUKOCYTESUR" Sepsis  Labs: @LABRCNTIP (procalcitonin:4,lacticidven:4) ) Recent Results (from the past 240 hour(s))  SARS Coronavirus 2 by RT PCR (hospital order, performed in [facility] hospital lab) *  cepheid single result test* Anterior Nasal Swab     Status: None   Collection Time: 04/15/22  5:20 AM   Specimen: Anterior Nasal Swab  Result Value Ref Range Status   SARS Coronavirus 2 by RT PCR NEGATIVE NEGATIVE Final    Comment: (NOTE) SARS-CoV-2 target nucleic acids are NOT DETECTED.  The SARS-CoV-2 RNA is generally detectable in upper and lower respiratory specimens during the acute phase of infection. The lowest concentration of SARS-CoV-2 viral copies this assay can detect is 250 copies / mL. A negative result does not preclude SARS-CoV-2 infection and should not be used as the sole basis for treatment or other patient management decisions.  A negative result may occur with improper specimen collection / handling, submission of specimen other than nasopharyngeal swab, presence of viral mutation(s) within the areas targeted by this assay, and inadequate number of viral copies (<250 copies / mL). A negative result must be combined with clinical observations, patient history, and epidemiological information.  Fact Sheet for Patients:   RoadLapTop.co.zahttps://www.fda.gov/media/158405/download  Fact Sheet for Healthcare Providers: http://kim-miller.com/https://www.fda.gov/media/158404/download  This test is not yet approved or  cleared by the [place] FDA and has been authorized for detection and/or diagnosis of SARS-CoV-2 by FDA under an Emergency Use Authorization (EUA).  This EUA will remain in effect (meaning this test can be used) for the duration of the COVID-19 declaration under Section 564(b)(1) of the Act, 21 U.S.C. section 360bbb-3(b)(1), unless the authorization is terminated or revoked sooner.  Performed at [facility], [address] [place], [place] [address]      Radiological Exams on Admission: CT  CHEST WO CONTRAST  Result Date: 04/15/2022 CLINICAL DATA:  Respiratory illness EXAM: CT CHEST WITHOUT CONTRAST TECHNIQUE: Multidetector CT imaging of the chest was performed following the standard protocol without IV contrast. RADIATION DOSE REDUCTION: This exam was performed according to the departmental dose-optimization program which includes automated exposure control, adjustment of the mA and/or kV according to patient size and/or use of iterative reconstruction technique. COMPARISON:  12/24/2020 FINDINGS: Cardiovascular: No significant vascular findings. Normal heart size. No pericardial effusion. Thoracic aortic atherosclerosis. Multi vessel coronary artery atherosclerosis. Mediastinum/Nodes: No enlarged mediastinal or axillary lymph nodes. Thyroid gland, trachea, and esophagus demonstrate no significant findings. Lungs/Pleura: Right upper lobe perihilar airspace disease most concerning for pneumonia. Small area of airspace disease in the superior segment of the right lower lobe. No pleural effusion or pneumothorax. Mild bibasilar atelectasis. Upper Abdomen: No acute upper abdominal abnormality. Cholelithiasis without pericholecystic inflammatory changes. Musculoskeletal: Mild cervicothoracic spine spondylosis. No acute osseous abnormality. No aggressive osseous lesion. IMPRESSION: 1. Right upper lobe perihilar airspace disease and to a much lesser extent right lower lobe airspace disease in the superior segment, most concerning for pneumonia. 2. Cholelithiasis. 3. Aortic Atherosclerosis (ICD10-I70.0). Electronically Signed   By: Elige KoHetal  Patel M.D.   On: 04/15/2022 07:34   DG Chest 2 View  Result Date: 04/15/2022 CLINICAL DATA:  Cough shortness of breath and weakness. EXAM: CHEST - 2 VIEW COMPARISON:  PA Lat 12/24/2020. FINDINGS: Heart size and vascular pattern are normal. There is mild aortic calcification and tortuosity with unchanged mediastinal configuration. There is a right upper lobe suprahilar  irregular opacity measuring 1.8 cm only visible on the PA view concerning for a nodule or small infiltrate. The remaining lungs clear. There is a stent in the LAD coronary artery. Osteopenia. IMPRESSION: Right upper lobe suprahilar irregular opacity measuring 1.8 cm, concern for nodule or small infiltrate. If this is believed to  be a small pneumonia then a follow-up study is recommended after treatment to reassess. If pneumonia is not suspected then CT is recommended at this time. Electronically Signed   By: Almira Bar M.D.   On: 04/15/2022 05:32      Assessment/Plan Principal Problem:   CAP (community acquired pneumonia) Active Problems:   Sepsis (HCC)   Atrial fibrillation with rapid ventricular response (HCC)   HTN (hypertension)   CAD (coronary artery disease)   Obesity with body mass index (BMI) of 30.0 to 39.9    Assessment and Plan: * CAP (community acquired pneumonia) Sepsis due to CAP: Patient meets criteria for sepsis with WBC 18.3, heart rate up to 140s, RR 33.  Lactic acid is normal 1.2.  Blood pressure is soft, but currently hemodynamically stable.  -Admited to progressive unit as inpatient - IV Rocephin and azithromycin - Mucinex for cough  - Bronchodilators - Urine legionella and S. pneumococcal antigen - Follow up blood culture x2, sputum culture - will get Procalcitonin - IVF: 1L of NS bolus   Sepsis (HCC) -see above  Atrial fibrillation with rapid ventricular response (HCC) HR upto 140s --> 80s after started Cardizem gtt. Pt is not taking AC, possibly due to history of GI bleeding -continue cardizem gtt -hold oral cardizem -continue coreg   Obesity with body mass index (BMI) of 30.0 to 39.9  BMI= 30.41   and BW= 90.7 -Diet and exercise.   -Encouraged to lose weight.   CAD (coronary artery disease) No CP -Continue aspirin -hold Imdur due to softer blood pressure -As needed nitroglycerin  HTN (hypertension) Blood pressures are soft. - Patient  is on Cardizem drip -Continue Coreg -Hold HCTZ, Imdur and oral Cardizem -hold lasix (pt received 20 mg of Lasix in the ED)             DVT ppx: SQ Lovenox  Code Status: Full code  Family Communication: Yes, patient's nephew by phone  Disposition Plan:  Anticipate discharge back to previous environment  Consults called:  none  Admission status and Level of care: Progressive:  as inpt          Severity of Illness:  The appropriate patient status for this patient is INPATIENT. Inpatient status is judged to be reasonable and necessary in order to provide the required intensity of service to ensure the patient's safety. The patient's presenting symptoms, physical exam findings, and initial radiographic and laboratory data in the context of their chronic comorbidities is felt to place them at high risk for further clinical deterioration. Furthermore, it is not anticipated that the patient will be medically stable for discharge from the hospital within 2 midnights of admission.   * I certify that at the point of admission it is my clinical judgment that the patient will require inpatient hospital care spanning beyond 2 midnights from the point of admission due to high intensity of service, high risk for further deterioration and high frequency of surveillance required.*       Date of Service 04/15/2022    Lorretta Harp [facility]   If 7PM-7AM, please contact night-coverage [email or web address] 04/15/2022, 12:38 PM

## 2022-04-15 NOTE — ED Provider Notes (Signed)
[id number] [facility] Provider Note    Event Date/Time   First MD Initiated Contact with Patient 04/15/22 0404     (approximate)   History   Shortness of Breath   HPI  Allen Terry is a 86 y.o. male who presents to the ED from home with a chief complaint of cough, shortness of breath and increased leg swelling.  Patient has a history of CAD, hypertension, DOE, PAF not on anticoagulation due to prior GIB.  Denies fever/chills, chest pain, abdominal pain, nausea, vomiting or dizziness.  Reports compliance with medications.     Past Medical History   Past Medical History:  Diagnosis Date   Acute lower GI bleeding 2015   "related to blood thinners"   Anemia    Bursitis of both shoulders    CAD (coronary artery disease)    a. s/p PCI 01/17/2011 85% proximal RCA with DES. b. PCI 09/05/2014 60% mid LAD lesion, with DES  b. Cath 02/13/2016 80% ost OM2 treated with 2.7514 mm Resolute DES. d. Cath 12/2018 s/p DES to prox-mid LAD with residual 70% diagonal disease for medical therapy, EF 55-65%.   HTN (hypertension)    "not anymore" (02/12/2016)   Hyperlipidemia    Noncompliance    Pneumonia    "double"   Pre-diabetes    Tuberculosis    "I've got 25% somewhere in my body" (02/12/2016)     Active Problem List   Patient Active Problem List   Diagnosis Date Noted   Atrial fibrillation with RVR (HCC) 12/24/2020   Iron deficiency anemia 12/24/2020   Pre-diabetes 01/02/2019   Hypokalemia 01/02/2019   Hyperlipidemia    HTN (hypertension)    CAD in native artery    DOE (dyspnea on exertion)    Atypical chest pain 02/12/2016     Past Surgical History   Past Surgical History:  Procedure Laterality Date   CARDIAC CATHETERIZATION N/A 02/13/2016   Procedure: Left Heart Cath and Coronary Angiography;  Surgeon: Lennette Bihari, MD;  Location: [facility] INVASIVE CV LAB;  Service: Cardiovascular;  Laterality: N/A;   CARDIAC CATHETERIZATION N/A 02/13/2016   Procedure: Coronary  Stent Intervention;  Surgeon: Lennette Bihari, MD;  Location: [facility] INVASIVE CV LAB;  Service: Cardiovascular;  Laterality: N/A;   CATARACT EXTRACTION W/ INTRAOCULAR LENS  IMPLANT, BILATERAL Bilateral    CORONARY ANGIOPLASTY WITH STENT PLACEMENT     s/p PCI 01/17/2011 85% proximal RCA with DES, PCI 09/05/2014 60% mid LAD lesion, with DES    CORONARY STENT INTERVENTION N/A 01/01/2019   Procedure: CORONARY STENT INTERVENTION;  Surgeon: Corky Crafts, MD;  Location: [facility] INVASIVE CV LAB;  Service: Cardiovascular;  Laterality: N/A;   LEFT HEART CATH AND CORONARY ANGIOGRAPHY N/A 01/01/2019   Procedure: LEFT HEART CATH AND CORONARY ANGIOGRAPHY;  Surgeon: Corky Crafts, MD;  Location: [facility] INVASIVE CV LAB;  Service: Cardiovascular;  Laterality: N/A;   TONSILLECTOMY     UPPER GI ENDOSCOPY     s/p GI bleed related to [facility] Medications   Prior to Admission medications   Medication Sig Start Date End Date Taking? Authorizing Provider  acetaminophen (TYLENOL) 500 MG tablet Take 1,000 mg by mouth every 6 (six) hours as needed for mild pain.    [provider]  aspirin EC 81 MG tablet Take 81 mg by mouth daily.     [provider]  carvedilol (COREG) 6.25 MG tablet Take 1 tablet (6.25 mg total) by mouth 2 (  two) times daily with a meal. 12/26/20   Marinda Elk, MD  clobetasol cream (TEMOVATE) 0.05 % Apply 1 application topically 2 (two) times daily. 12/18/20   [provider]  diclofenac Sodium (VOLTAREN) 1 % GEL Apply 1 application topically 4 (four) times daily. 10/31/20   [provider]  diltiazem (DILACOR XR) 120 MG 24 hr capsule Take 120 mg by mouth daily.    [provider]  fluticasone (FLONASE) 50 MCG/ACT nasal spray Place 1 spray into both nostrils 2 (two) times daily as needed for allergies. 11/20/20   [provider]  hydrochlorothiazide (HYDRODIURIL) 25 MG tablet Take 25 mg by mouth daily. 10/27/20   [provider]   isosorbide mononitrate (IMDUR) 30 MG 24 hr tablet Take 1 tablet (30 mg total) by mouth daily. 12/26/20   Marinda Elk, MD  nitroGLYCERIN (NITROSTAT) 0.4 MG SL tablet Place 1 tablet (0.4 mg total) under the tongue every 5 (five) minutes x 3 doses as needed for chest pain. 02/14/16   Azalee Course, PA  pantoprazole (PROTONIX) 40 MG tablet Take 40 mg by mouth daily.    [provider]  potassium chloride SA (KLOR-CON) 20 MEQ tablet Take 20 mEq by mouth daily. 10/23/20   [provider]  tamsulosin (FLOMAX) 0.4 MG CAPS capsule Take 0.4 mg by mouth daily. 10/28/20   [provider]     Allergies  Ferrous sulfate, Meclizine, Metoprolol, Pravastatin, Ticagrelor, Lisinopril, and Penicillins   Family History   Family History  Problem Relation Age of Onset   Arrhythmia Mother    Arrhythmia Sister    Stroke Father    Hypertension Brother      Physical Exam  Triage Vital Signs: ED Triage Vitals  Enc Vitals Group     BP 04/15/22 0348 136/87     Pulse Rate 04/15/22 0348 69     Resp 04/15/22 0348 16     Temp 04/15/22 0348 98.5 F (36.9 C)     Temp Source 04/15/22 0348 Oral     SpO2 04/15/22 0348 95 %     Weight 04/15/22 0349 200 lb (90.7 kg)     Height 04/15/22 0349 5\' 8"  (1.727 m)     Head Circumference --      Peak Flow --      Pain Score --      Pain Loc --      Pain Edu? --      Excl. in GC? --     Updated Vital Signs: BP (!) 148/75   Pulse (!) 59   Temp 98.5 F (36.9 C) (Oral)   Resp 15   Ht 5\' 8"  (1.727 m)   Wt 90.7 kg   SpO2 98%   BMI 30.41 kg/m    General: Awake, mild distress.  CV:  Tachycardic, irregularly irregular rate.  Good peripheral perfusion.  Resp:  Normal effort.  Faint bibasilar rales. Abd:  Nontender.  No distention.  Other:  2+ BLE pitting edema.   ED Results / Procedures / Treatments  Labs (all labs ordered are listed, but only abnormal results are displayed) Labs Reviewed  CBC - Abnormal; Notable for the  following components:      Result Value   WBC 18.3 (*)    Platelets 405 (*)    All other components within normal limits  COMPREHENSIVE METABOLIC PANEL - Abnormal; Notable for the following components:   Sodium 134 (*)    CO2 21 (*)  Glucose, Bld 135 (*)    AST 47 (*)    Total Bilirubin 1.8 (*)    All other components within normal limits  T4, FREE - Abnormal; Notable for the following components:   Free T4 1.28 (*)    All other components within normal limits  SARS CORONAVIRUS 2 BY RT PCR  CULTURE, BLOOD (ROUTINE X 2)  CULTURE, BLOOD (ROUTINE X 2)  BRAIN NATRIURETIC PEPTIDE  TSH  LACTIC ACID, PLASMA  DIFFERENTIAL  TROPONIN I (HIGH SENSITIVITY)  TROPONIN I (HIGH SENSITIVITY)     EKG  ED ECG REPORT I, Adelina Collard J, the attending physician, personally viewed and interpreted this ECG.   Date: 04/15/2022  EKG Time: 0351  Rate: 143  Rhythm: atrial fibrillation, rate 143  Axis: Normal  Intervals:none  ST&T Change: Nonspecific    RADIOLOGY I have independently visualized and interpreted patient's chest x-ray as well as noted the radiology interpretation:  Chest x-ray: Right upper lobe irregular opacity concerning for nodule versus small infiltrate  Official radiology report(s): DG Chest 2 View  Result Date: 04/15/2022 CLINICAL DATA:  Cough shortness of breath and weakness. EXAM: CHEST - 2 VIEW COMPARISON:  PA Lat 12/24/2020. FINDINGS: Heart size and vascular pattern are normal. There is mild aortic calcification and tortuosity with unchanged mediastinal configuration. There is a right upper lobe suprahilar irregular opacity measuring 1.8 cm only visible on the PA view concerning for a nodule or small infiltrate. The remaining lungs clear. There is a stent in the LAD coronary artery. Osteopenia. IMPRESSION: Right upper lobe suprahilar irregular opacity measuring 1.8 cm, concern for nodule or small infiltrate. If this is believed to be a small pneumonia then a follow-up  study is recommended after treatment to reassess. If pneumonia is not suspected then CT is recommended at this time. Electronically Signed   By: Telford Nab M.D.   On: 04/15/2022 05:32     PROCEDURES:  Critical Care performed: Yes, see critical care procedure note(s)  CRITICAL CARE Performed by: Paulette Blanch   Total critical care time: 45 minutes  Critical care time was exclusive of separately billable procedures and treating other patients.  Critical care was necessary to treat or prevent imminent or life-threatening deterioration.  Critical care was time spent personally by me on the following activities: development of treatment plan with patient and/or surrogate as well as nursing, discussions with consultants, evaluation of patient's response to treatment, examination of patient, obtaining history from patient or surrogate, ordering and performing treatments and interventions, ordering and review of laboratory studies, ordering and review of radiographic studies, pulse oximetry and re-evaluation of patient's condition.   Marland Kitchen1-3 Lead EKG Interpretation  Performed by: Paulette Blanch, MD Authorized by: Paulette Blanch, MD     Interpretation: abnormal     ECG rate:  143   ECG rate assessment: tachycardic     Rhythm: atrial fibrillation     Ectopy: none     Conduction: normal   Comments:     Patient placed on cardiac monitor to evaluate for arrhythmias    MEDICATIONS ORDERED IN ED: Medications  diltiazem (CARDIZEM) injection 15 mg (has no administration in time range)  furosemide (LASIX) injection 20 mg (20 mg Intravenous Given 04/15/22 0422)  diltiazem (CARDIZEM) injection 15 mg (15 mg Intravenous Given 04/15/22 0421)  chlorpheniramine-HYDROcodone 10-8 MG/5ML suspension 5 mL (5 mLs Oral Given 04/15/22 0528)     IMPRESSION / MDM / ASSESSMENT AND PLAN / ED COURSE  I reviewed the triage vital  signs and the nursing notes.                             86 year old male presenting  with shortness of breath, increased pedal edema; currently in atrial fibrillation with rapid ventricular rate.  I have identified this patient to potentially have an acute, life-threatening condition and at minimum an acute exacerbation of a chronic condition. Differential includes, but is not limited to, viral syndrome, bronchitis including COPD exacerbation, pneumonia, reactive airway disease including asthma, CHF including exacerbation with or without pulmonary/interstitial edema, pneumothorax, ACS, thoracic trauma, and pulmonary embolism.   I have personally reviewed patient's records and note his cardiology office visit from 01/02/2022 where it states he is on Lasix 20 mg daily, continue diltiazem 120 mg daily, carvedilol 12.5 mg twice daily.  It is noted that patient is not anticoagulated secondary to prior GI bleed.  The patient is on the cardiac monitor to evaluate for evidence of arrhythmia and/or significant heart rate changes.  We will obtain cardiac panel, chest x-ray.  Initiate diuresis with IV Lasix, rate control with IV Cardizem.  Consider Cardizem drip if rate continues to be uncontrolled.  Will reassess.  Clinical Course as of 04/15/22 0657  Mon Apr 15, 2022  0449 HR 90s-100s, nonproductive cough noted.  We will check COVID and administer Tussionex. [JS]  [id number] X-ray concerning for irregularity versus small pneumonia; will obtain CT scan. [JS]  0609 Lactic acid negative [JS]  0656 Patient back from CT scan, heart rate noted 130s.  Will redose IV Cardizem.  If heart rate is able to be controlled, patient is pending COVID-19 swab and repeat troponin.  Would ambulate patient to ensure heart rate stability and lack of hypoxia and in that event, can be safely discharged home.  Otherwise, would admit patient for atrial fibrillation with RVR on diltiazem drip.  Care transferred to Dr. Tamala Julian at change of shift. [JS]    Clinical Course User Index [JS] Paulette Blanch, MD     FINAL CLINICAL  IMPRESSION(S) / ED DIAGNOSES   Final diagnoses:  Shortness of breath  Acute on chronic congestive heart failure, unspecified heart failure type [facility])  Atrial fibrillation with rapid ventricular response ([place])     Rx / DC Orders   ED Discharge Orders     None        Note:  This document was prepared using Dragon voice recognition software and may include unintentional dictation errors.   Paulette Blanch, MD 04/15/22 [phone]

## 2022-04-15 NOTE — ED Triage Notes (Signed)
Pt states has been coughing for several days, increased pedal edema and shob. Pt states no fever, no diarrhea, no vomiting.

## 2022-04-15 NOTE — Assessment & Plan Note (Signed)
Sepsis due to CAP: Patient meets criteria for sepsis with WBC 18.3, heart rate up to 140s, RR 33.  Lactic acid is normal 1.2.  Blood pressure is soft, but currently hemodynamically stable.  -Admited to progressive unit as inpatient - IV Rocephin and azithromycin - Mucinex for cough  - Bronchodilators - Urine legionella and S. pneumococcal antigen - Follow up blood culture x2, sputum culture - will get Procalcitonin - IVF: 1L of NS bolus

## 2022-04-16 ENCOUNTER — Other Ambulatory Visit: Payer: Self-pay

## 2022-04-16 DIAGNOSIS — J189 Pneumonia, unspecified organism: Secondary | ICD-10-CM | POA: Diagnosis not present

## 2022-04-16 DIAGNOSIS — I4891 Unspecified atrial fibrillation: Secondary | ICD-10-CM | POA: Diagnosis not present

## 2022-04-16 DIAGNOSIS — A419 Sepsis, unspecified organism: Secondary | ICD-10-CM | POA: Diagnosis not present

## 2022-04-16 LAB — LEGIONELLA PNEUMOPHILA SEROGP 1 UR AG: L. pneumophila Serogp 1 Ur Ag: NEGATIVE

## 2022-04-16 LAB — CBC
HCT: 37.6 % — ABNORMAL LOW (ref 39.0–52.0)
Hemoglobin: 11.9 g/dL — ABNORMAL LOW (ref 13.0–17.0)
MCH: 27.8 pg (ref 26.0–34.0)
MCHC: 31.6 g/dL (ref 30.0–36.0)
MCV: 87.9 fL (ref 80.0–100.0)
Platelets: 420 10*3/uL — ABNORMAL HIGH (ref 150–400)
RBC: 4.28 MIL/uL (ref 4.22–5.81)
RDW: 14.2 % (ref 11.5–15.5)
WBC: 14.1 10*3/uL — ABNORMAL HIGH (ref 4.0–10.5)
nRBC: 0 % (ref 0.0–0.2)

## 2022-04-16 LAB — EXPECTORATED SPUTUM ASSESSMENT W GRAM STAIN, RFLX TO RESP C

## 2022-04-16 MED ORDER — MENTHOL 3 MG MT LOZG
1.0000 | LOZENGE | OROMUCOSAL | Status: DC | PRN
  Administered 2022-04-16: 3 mg via ORAL
  Filled 2022-04-16: qty 9

## 2022-04-16 NOTE — ED Notes (Signed)
Pt is sitting on side of stretcher coughing. Pt was offered a neb treatment that he did accept.

## 2022-04-16 NOTE — ED Notes (Signed)
PT's forearm IV has become infiltrated due to pt standing up to use urinal. Iv was removed. Pt has become angry at the noise and was complaining about that and the wait. Pt was told that there are attempts to get the room but there are numerous people waiting to go to inpatient. Pt then said "gimme something to spit in", "tie my gown", "fix my wires", move my table back over here". I told pt I had to fix wires then move table as he wanted it where I was standing. Pt told nurse not to shout at him. This nurse explained that I was trying to explain how the wires were going to get fixed. Pt was also told that he received a bag and it was by his hand.

## 2022-04-16 NOTE — Progress Notes (Signed)
Admission profile updated. ?

## 2022-04-16 NOTE — Progress Notes (Signed)
PROGRESS NOTE   HPI was taken from Dr. Blaine Hamper: Allen Terry is a 86 y.o. male with medical history significant of hypertension, hyperlipidemia, GERD, CAD, s/p of DES, lower GI bleeding, atrial fibrillation not on anticoagulants, obesity with BMI 30.41, who presents with cough and shortness breath.   Patient states that he has been having cough and shortness breath for several days, which has been progressively worsening.  He coughs up dark-colored sputum.  Denies chest pain, fever or chills.  Patient states that he does not have nausea vomiting, diarrhea or abdominal pain at rest, but the coughing induces some abdominal wall pain.  No symptoms of UTI.     Patient was found to have atrial fibrillation with RVR, heart rate up to 140s, Cardizem drip was started in the emergency room.   Data Reviewed and ED Course: pt was found to have WBC 18.3, lactic acid 1.2, troponin level 13, 10, BNP 82, negative COVID PCR, GFR> 60, temperature normal, blood pressure soft 96/72, 33, oxygen saturation 95% on room air.  CT of the chest that showed infiltration in the right upper lobe and right lower lobe.  Patient is admitted to PCU as inpatient.     Galvin Aversa Mclaren  VPX:[id number] DOB: 06-28-1936 DOA: 04/15/2022 PCP: Pcp, No  Assessment & Plan:   Principal Problem:   CAP (community acquired pneumonia) Active Problems:   Sepsis ([place])   Atrial fibrillation with rapid ventricular response (HCC)   HTN (hypertension)   CAD (coronary artery disease)   Obesity with body mass index (BMI) of 30.0 to 39.9  Assessment and Plan: CAP : continue on IV rocephin, azithromycin, bronchodilators & encourage incentive spirometry. Procal < 0.10. Strep is neg and legionella is pending.    Sepsis: met criteria w/ leukocytosis, tachycardia, tachypnea and likely CAP. Continue on IV rocephin, azithromycin. Blood cxs NGTD    Likely PAF: w/ RVR. Continue on cardizem drip, coreg. Not on anticoagulation, possibly secondary to hx of  GI bleeding    Obesity: BMI 30.4. Complicates overall care & prognosis   Hx of CAD: no chest pain. Continue on aspirin, coreg. Hold imdur    HTN: continue on cardizem drip, coreg. Hold HCTZ, imdur, lasix    Thrombocytosis: etiology unclear. Will continue to monitor      DVT prophylaxis: lovenox  Code Status: full  Family Communication:  Disposition Plan: depends on PT/OT recs   Level of care: Progressive  Status is: Inpatient Remains inpatient appropriate because: severity of illness   Consultants:    Procedures:   Antimicrobials: azithromycin, rocephin   Subjective: Pt c/o shortness of breath   Objective: Vitals:   04/16/22 0515 04/16/22 0600 04/16/22 0645 04/16/22 0729  BP: 132/83 125/76 116/67 116/67  Pulse: 99 95 87 99  Resp:    18  Temp:      TempSrc:      SpO2: 95% 94% 93% 95%  Weight:      Height:        Intake/Output Summary (Last 24 hours) at 04/16/2022 0816 Last data filed at 04/15/2022 2117 Gross per 24 hour  Intake 1331.11 ml  Output --  Net 1331.11 ml   Filed Weights   04/15/22 0349  Weight: 90.7 kg    Examination:  General exam: Appears calm and comfortable  Respiratory system: Clear to auscultation. Respiratory effort normal. Cardiovascular system: S1 & S2 +. No rubs, gallops or clicks Gastrointestinal system: Abdomen is obese soft and nontender.  Normal bowel sounds heard. Central nervous  system: Alert and oriented. Moves all extremities Psychiatry: Judgement and insight appear normal. Flat mood and affect     Data Reviewed: I have personally reviewed following labs and imaging studies  CBC: Recent Labs  Lab 04/15/22 0402 04/16/22 0717  WBC 18.3* 14.1*  HGB 13.4 11.9*  HCT 42.1 37.6*  MCV 87.3 87.9  PLT 405* 122*   Basic Metabolic Panel: Recent Labs  Lab 04/15/22 0402 04/15/22 0739  NA 134*  --   K 4.1  --   CL 100  --   CO2 21*  --   GLUCOSE 135*  --   BUN 9  --   CREATININE 0.93  --   CALCIUM 8.9  --   MG   --  2.5*   GFR: Estimated Creatinine Clearance: 65.8 mL/min (by C-G formula based on SCr of 0.93 mg/dL). Liver Function Tests: Recent Labs  Lab 04/15/22 0402  AST 47*  ALT 14  ALKPHOS 104  BILITOT 1.8*  PROT 7.2  ALBUMIN 3.8   No results for input(s): "LIPASE", "AMYLASE" in the last 168 hours. No results for input(s): "AMMONIA" in the last 168 hours. Coagulation Profile: No results for input(s): "INR", "PROTIME" in the last 168 hours. Cardiac Enzymes: No results for input(s): "CKTOTAL", "CKMB", "CKMBINDEX", "TROPONINI" in the last 168 hours. BNP (last 3 results) No results for input(s): "PROBNP" in the last 8760 hours. HbA1C: No results for input(s): "HGBA1C" in the last 72 hours. CBG: No results for input(s): "GLUCAP" in the last 168 hours. Lipid Profile: No results for input(s): "CHOL", "HDL", "LDLCALC", "TRIG", "CHOLHDL", "LDLDIRECT" in the last 72 hours. Thyroid Function Tests: Recent Labs    04/15/22 0402  TSH 2.332  FREET4 1.28*   Anemia Panel: No results for input(s): "VITAMINB12", "FOLATE", "FERRITIN", "TIBC", "IRON", "RETICCTPCT" in the last 72 hours. Sepsis Labs: Recent Labs  Lab 04/15/22 0520 04/15/22 1200  PROCALCITON  --  <0.10  LATICACIDVEN 1.2  --     Recent Results (from the past 240 hour(s))  SARS Coronavirus 2 by RT PCR (hospital order, performed in [facility] hospital lab) *cepheid single result test* Anterior Nasal Swab     Status: None   Collection Time: 04/15/22  5:20 AM   Specimen: Anterior Nasal Swab  Result Value Ref Range Status   SARS Coronavirus 2 by RT PCR NEGATIVE NEGATIVE Final    Comment: (NOTE) SARS-CoV-2 target nucleic acids are NOT DETECTED.  The SARS-CoV-2 RNA is generally detectable in upper and lower respiratory specimens during the acute phase of infection. The lowest concentration of SARS-CoV-2 viral copies this assay can detect is 250 copies / mL. A negative result does not preclude SARS-CoV-2 infection and should  not be used as the sole basis for treatment or other patient management decisions.  A negative result may occur with improper specimen collection / handling, submission of specimen other than nasopharyngeal swab, presence of viral mutation(s) within the areas targeted by this assay, and inadequate number of viral copies (<250 copies / mL). A negative result must be combined with clinical observations, patient history, and epidemiological information.  Fact Sheet for Patients:   https://www.patel.info/  Fact Sheet for Healthcare Providers: https://hall.com/  This test is not yet approved or  cleared by the [place] FDA and has been authorized for detection and/or diagnosis of SARS-CoV-2 by FDA under an Emergency Use Authorization (EUA).  This EUA will remain in effect (meaning this test can be used) for the duration of the COVID-19 declaration under Section  564(b)(1) of the Act, 21 U.S.C. section 360bbb-3(b)(1), unless the authorization is terminated or revoked sooner.  Performed at [facility], [address] [place], [place] [address]   Expectorated Sputum Assessment w Gram Stain, Rflx to Resp Cult     Status: None   Collection Time: 04/16/22  1:49 AM   Specimen: Sputum  Result Value Ref Range Status   Specimen Description SPUTUM  Final   Special Requests NONE  Final   Sputum evaluation   Final    THIS SPECIMEN IS ACCEPTABLE FOR SPUTUM CULTURE Performed at [facility], [address] [place], [place] [address]    Report Status 04/16/2022 FINAL  Final         Radiology Studies: CT CHEST WO CONTRAST  Result Date: 04/15/2022 CLINICAL DATA:  Respiratory illness EXAM: CT CHEST WITHOUT CONTRAST TECHNIQUE: Multidetector CT imaging of the chest was performed following the standard protocol without IV contrast. RADIATION DOSE REDUCTION: This exam was performed according to the departmental dose-optimization  program which includes automated exposure control, adjustment of the mA and/or kV according to patient size and/or use of iterative reconstruction technique. COMPARISON:  12/24/2020 FINDINGS: Cardiovascular: No significant vascular findings. Normal heart size. No pericardial effusion. Thoracic aortic atherosclerosis. Multi vessel coronary artery atherosclerosis. Mediastinum/Nodes: No enlarged mediastinal or axillary lymph nodes. Thyroid gland, trachea, and esophagus demonstrate no significant findings. Lungs/Pleura: Right upper lobe perihilar airspace disease most concerning for pneumonia. Small area of airspace disease in the superior segment of the right lower lobe. No pleural effusion or pneumothorax. Mild bibasilar atelectasis. Upper Abdomen: No acute upper abdominal abnormality. Cholelithiasis without pericholecystic inflammatory changes. Musculoskeletal: Mild cervicothoracic spine spondylosis. No acute osseous abnormality. No aggressive osseous lesion. IMPRESSION: 1. Right upper lobe perihilar airspace disease and to a much lesser extent right lower lobe airspace disease in the superior segment, most concerning for pneumonia. 2. Cholelithiasis. 3. Aortic Atherosclerosis (ICD10-I70.0). Electronically Signed   By: Kathreen Devoid M.D.   On: 04/15/2022 07:34   DG Chest 2 View  Result Date: 04/15/2022 CLINICAL DATA:  Cough shortness of breath and weakness. EXAM: CHEST - 2 VIEW COMPARISON:  PA Lat 12/24/2020. FINDINGS: Heart size and vascular pattern are normal. There is mild aortic calcification and tortuosity with unchanged mediastinal configuration. There is a right upper lobe suprahilar irregular opacity measuring 1.8 cm only visible on the PA view concerning for a nodule or small infiltrate. The remaining lungs clear. There is a stent in the LAD coronary artery. Osteopenia. IMPRESSION: Right upper lobe suprahilar irregular opacity measuring 1.8 cm, concern for nodule or small infiltrate. If this is believed  to be a small pneumonia then a follow-up study is recommended after treatment to reassess. If pneumonia is not suspected then CT is recommended at this time. Electronically Signed   By: Telford Nab M.D.   On: 04/15/2022 05:32        Scheduled Meds:  aspirin EC  81 mg Oral Daily   carvedilol  6.25 mg Oral BID WC   enoxaparin (LOVENOX) injection  0.5 mg/kg Subcutaneous Q24H   pantoprazole  40 mg Oral Daily   tamsulosin  0.4 mg Oral QPM   Continuous Infusions:  azithromycin Stopped (04/15/22 1113)   cefTRIAXone (ROCEPHIN)  IV Stopped (04/15/22 1013)   diltiazem (CARDIZEM) infusion 7.5 mg/hr (04/16/22 0324)     LOS: 1 day    Time spent: 35 mins     Wyvonnia Dusky, MD [facility] Pager [phone]  If 7PM-7AM, please  contact night-coverage [email or web address] 04/16/2022, 8:16 AM

## 2022-04-16 NOTE — ED Notes (Cosign Needed)
Bed ready- Trudy Ultraviolet light completed.

## 2022-04-16 NOTE — ED Notes (Signed)
Pt put on in patient bed. Pt will not remain in bed because he has to "cough it up" and the only way to do that is by standing. Pt's IV pole has been moved to try to limit any pulling. Pt was told he needed to be drinking fluids. Pt's leads put back on.

## 2022-04-17 NOTE — Significant Event (Addendum)
         NAME: Allen Terry MRN: [id number] DOB : 1936-09-13                                                                               Against Medical Advice  Patient expresses desire to leave the Hospital immediately. Patient located in hallway behind ICU being rolled out in wheelchair by transport staff and accompanied by [facility]. At this time patient has been warned that this is not Medically advisable and can result in Medical complications like Death and Disability. Specifically discussed ongoing treatment for Pneumonia and need for IV antibiotics. Patient has full decision making capacity and understands and accepts the risks involved and assumes full responsibilty of this decision. Allen Terry declined to sign AMA form.  Allen Terry has also been advised that if he feels the need for further medical assistance to return to the closest ER or dial 9-1-1.    Bishop Limbo MHA, MSN, FNP-BC Nurse Practitioner [facility]

## 2022-04-17 NOTE — Progress Notes (Signed)
Pt called nurse to room / doorway with polite demand to remove IV so he may go home immediately. No discourse as to reasoning save for "iIve decided" - Pt understands situation and scenario, He knows the name of his nurses/ tech, address , birthday, wife's number, where he parked the car. He can relate reason for admission and states he will see his PCP in the morning from his home.IV removed carefully as skin is very fragile at site- [facility] to DC with transport via WC to ED entrance and waiting personal vehicle. DR note relates AMA- pt refused to sign any provided paperwork. Left in NOD.

## 2022-04-18 LAB — CULTURE, RESPIRATORY W GRAM STAIN
Culture: NORMAL
Gram Stain: NONE SEEN

## 2022-04-20 LAB — CULTURE, BLOOD (ROUTINE X 2)
Culture: NO GROWTH
Culture: NO GROWTH

## 2022-04-24 NOTE — Discharge Summary (Signed)
Physician Discharge Summary  Allen Terry YQI:[id number] DOB: December 17, 1935 DOA: 04/15/2022  PCP: Pcp, No  Admit date: 04/15/2022 Discharge date: 04/24/2022  Admitted From: home  Disposition:  pt left AMA  Recommendations for Outpatient Follow-up:  Pt left AMA  Home Health: Equipment/Devices:   Discharge Condition:guarded  CODE STATUS: full  Diet recommendation: Heart Healthy   Brief/Interim Summary: HPI was taken from Dr. Blaine Hamper: Allen Terry is a 86 y.o. male with medical history significant of hypertension, hyperlipidemia, GERD, CAD, s/p of DES, lower GI bleeding, atrial fibrillation not on anticoagulants, obesity with BMI 30.41, who presents with cough and shortness breath.   Patient states that he has been having cough and shortness breath for several days, which has been progressively worsening.  He coughs up dark-colored sputum.  Denies chest pain, fever or chills.  Patient states that he does not have nausea vomiting, diarrhea or abdominal pain at rest, but the coughing induces some abdominal wall pain.  No symptoms of UTI.     Patient was found to have atrial fibrillation with RVR, heart rate up to 140s, Cardizem drip was started in the emergency room.   Data Reviewed and ED Course: pt was found to have WBC 18.3, lactic acid 1.2, troponin level 13, 10, BNP 82, negative COVID PCR, GFR> 60, temperature normal, blood pressure soft 96/72, 33, oxygen saturation 95% on room air.  CT of the chest that showed infiltration in the right upper lobe and right lower lobe.  Patient is admitted to PCU as inpatient.   Pt evidently left AMA on 04/17/22 afternoon. Please see Nurse Roderic Palau Dahm's note on 04/17/22 for more information. I went off service on 04/16/22 so I am unsure of what occurred the following day.   Discharge Diagnoses:  Principal Problem:   CAP (community acquired pneumonia) Active Problems:   Sepsis ([place])   Atrial fibrillation with rapid ventricular response (HCC)   HTN  (hypertension)   CAD (coronary artery disease)   Obesity with body mass index (BMI) of 30.0 to 39.9 CAP : continue on IV rocephin, azithromycin, bronchodilators & encourage incentive spirometry. Procal < 0.10. Strep is neg and legionella is pending.    Sepsis: met criteria w/ leukocytosis, tachycardia, tachypnea and likely CAP. Continue on IV rocephin, azithromycin. Blood cxs NGTD    Likely PAF: w/ RVR. Continue on cardizem drip, coreg. Not on anticoagulation, possibly secondary to hx of GI bleeding    Obesity: BMI 30.4. Complicates overall care & prognosis   Hx of CAD: no chest pain. Continue on aspirin, coreg. Hold imdur    HTN: continue on cardizem drip, coreg. Hold HCTZ, imdur, lasix    Thrombocytosis: etiology unclear. Will continue to monitor    Discharge Instructions   Allergies as of 04/17/2022       Reactions   Ferrous Sulfate Other (See Comments)   Face burning   Meclizine Other (See Comments)   Burning in face when taking iron   Metoprolol Other (See Comments)   abd pain   Pravastatin Other (See Comments)   Myalgias, even when combined with coenzyme q 10   Ticagrelor Other (See Comments)   Cough, sneezing and nasal congestion   Lisinopril Cough   Penicillins Hives, Rash   Did it involve swelling of the face/tongue/throat, SOB, or low BP? No Did it involve sudden or severe rash/hives, skin peeling, or any reaction on the inside of your mouth or nose? No Did you need to seek medical attention at a hospital or  doctor's office? Yes When did it last happen?   40 years    If all above answers are "NO", may proceed with cephalosporin use.        Medication List     ASK your doctor about these medications    acetaminophen 500 MG tablet Commonly known as: TYLENOL Take 1,000 mg by mouth every 6 (six) hours as needed for mild pain.   aspirin EC 81 MG tablet Take 81 mg by mouth daily.   carvedilol 6.25 MG tablet Commonly known as: COREG Take 1 tablet (6.25 mg  total) by mouth 2 (two) times daily with a meal.   clobetasol cream 0.05 % Commonly known as: TEMOVATE Apply 1 application topically 2 (two) times daily.   diclofenac Sodium 1 % Gel Commonly known as: VOLTAREN Apply 1 application topically 4 (four) times daily.   diltiazem 120 MG 24 hr capsule Commonly known as: DILACOR XR Take 120 mg by mouth daily.   fluticasone 50 MCG/ACT nasal spray Commonly known as: FLONASE Place 1 spray into both nostrils 2 (two) times daily as needed for allergies.   furosemide 20 MG tablet Commonly known as: LASIX Take 2 tablets by mouth daily.   hydrochlorothiazide 25 MG tablet Commonly known as: HYDRODIURIL Take 25 mg by mouth daily.   isosorbide mononitrate 30 MG 24 hr tablet Commonly known as: IMDUR Take 1 tablet (30 mg total) by mouth daily.   nitroGLYCERIN 0.4 MG SL tablet Commonly known as: NITROSTAT Place 1 tablet (0.4 mg total) under the tongue every 5 (five) minutes x 3 doses as needed for chest pain.   pantoprazole 40 MG tablet Commonly known as: PROTONIX Take 40 mg by mouth daily.   potassium chloride SA 20 MEQ tablet Commonly known as: KLOR-CON M Take 20 mEq by mouth daily.   tamsulosin 0.4 MG Caps capsule Commonly known as: FLOMAX Take 0.4 mg by mouth daily.        Allergies  Allergen Reactions   Ferrous Sulfate Other (See Comments)    Face burning   Meclizine Other (See Comments)    Burning in face when taking iron   Metoprolol Other (See Comments)    abd pain   Pravastatin Other (See Comments)    Myalgias, even when combined with coenzyme q 10   Ticagrelor Other (See Comments)    Cough, sneezing and nasal congestion   Lisinopril Cough   Penicillins Hives and Rash    Did it involve swelling of the face/tongue/throat, SOB, or low BP? No Did it involve sudden or severe rash/hives, skin peeling, or any reaction on the inside of your mouth or nose? No Did you need to seek medical attention at a hospital or doctor's  office? Yes When did it last happen?   40 years    If all above answers are "NO", may proceed with cephalosporin use.    Consultations:    Procedures/Studies: CT CHEST WO CONTRAST  Result Date: 04/15/2022 CLINICAL DATA:  Respiratory illness EXAM: CT CHEST WITHOUT CONTRAST TECHNIQUE: Multidetector CT imaging of the chest was performed following the standard protocol without IV contrast. RADIATION DOSE REDUCTION: This exam was performed according to the departmental dose-optimization program which includes automated exposure control, adjustment of the mA and/or kV according to patient size and/or use of iterative reconstruction technique. COMPARISON:  12/24/2020 FINDINGS: Cardiovascular: No significant vascular findings. Normal heart size. No pericardial effusion. Thoracic aortic atherosclerosis. Multi vessel coronary artery atherosclerosis. Mediastinum/Nodes: No enlarged mediastinal or axillary lymph nodes. Thyroid  gland, trachea, and esophagus demonstrate no significant findings. Lungs/Pleura: Right upper lobe perihilar airspace disease most concerning for pneumonia. Small area of airspace disease in the superior segment of the right lower lobe. No pleural effusion or pneumothorax. Mild bibasilar atelectasis. Upper Abdomen: No acute upper abdominal abnormality. Cholelithiasis without pericholecystic inflammatory changes. Musculoskeletal: Mild cervicothoracic spine spondylosis. No acute osseous abnormality. No aggressive osseous lesion. IMPRESSION: 1. Right upper lobe perihilar airspace disease and to a much lesser extent right lower lobe airspace disease in the superior segment, most concerning for pneumonia. 2. Cholelithiasis. 3. Aortic Atherosclerosis (ICD10-I70.0). Electronically Signed   By: Kathreen Devoid M.D.   On: 04/15/2022 07:34   DG Chest 2 View  Result Date: 04/15/2022 CLINICAL DATA:  Cough shortness of breath and weakness. EXAM: CHEST - 2 VIEW COMPARISON:  PA Lat 12/24/2020. FINDINGS: Heart  size and vascular pattern are normal. There is mild aortic calcification and tortuosity with unchanged mediastinal configuration. There is a right upper lobe suprahilar irregular opacity measuring 1.8 cm only visible on the PA view concerning for a nodule or small infiltrate. The remaining lungs clear. There is a stent in the LAD coronary artery. Osteopenia. IMPRESSION: Right upper lobe suprahilar irregular opacity measuring 1.8 cm, concern for nodule or small infiltrate. If this is believed to be a small pneumonia then a follow-up study is recommended after treatment to reassess. If pneumonia is not suspected then CT is recommended at this time. Electronically Signed   By: Telford Nab M.D.   On: 04/15/2022 05:32   (Echo, Carotid, EGD, Colonoscopy, ERCP)    Subjective: Pt c/o shortness of breath on 04/16/22   Discharge Exam: Vitals:   04/16/22 2000 04/16/22 2151  BP: 130/70 136/78  Pulse: 63 90  Resp: (!) 24 20  Temp:  97.9 F (36.6 C)  SpO2: 97% 94%   Vitals:   04/16/22 1200 04/16/22 1700 04/16/22 2000 04/16/22 2151  BP: (!) 102/47 (!) 120/94 130/70 136/78  Pulse: 88 99 63 90  Resp: 18 20 (!) 24 20  Temp:  98.3 F (36.8 C)  97.9 F (36.6 C)  TempSrc:  Oral    SpO2: 96% 92% 97% 94%  Weight:    92.9 kg  Height:    '5\' 8"'$  (1.727 m)    No PE exam done by myself on 04/17/22 as I was off of service then.    The results of significant diagnostics from this hospitalization (including imaging, microbiology, ancillary and laboratory) are listed below for reference.     Microbiology: Recent Results (from the past 240 hour(s))  SARS Coronavirus 2 by RT PCR (hospital order, performed in [facility] hospital lab) *cepheid single result test* Anterior Nasal Swab     Status: None   Collection Time: 04/15/22  5:20 AM   Specimen: Anterior Nasal Swab  Result Value Ref Range Status   SARS Coronavirus 2 by RT PCR NEGATIVE NEGATIVE Final    Comment: (NOTE) SARS-CoV-2 target nucleic acids are  NOT DETECTED.  The SARS-CoV-2 RNA is generally detectable in upper and lower respiratory specimens during the acute phase of infection. The lowest concentration of SARS-CoV-2 viral copies this assay can detect is 250 copies / mL. A negative result does not preclude SARS-CoV-2 infection and should not be used as the sole basis for treatment or other patient management decisions.  A negative result may occur with improper specimen collection / handling, submission of specimen other than nasopharyngeal swab, presence of viral mutation(s) within the areas targeted by  this assay, and inadequate number of viral copies (<250 copies / mL). A negative result must be combined with clinical observations, patient history, and epidemiological information.  Fact Sheet for Patients:   https://www.patel.info/  Fact Sheet for Healthcare Providers: https://hall.com/  This test is not yet approved or  cleared by the [place] FDA and has been authorized for detection and/or diagnosis of SARS-CoV-2 by FDA under an Emergency Use Authorization (EUA).  This EUA will remain in effect (meaning this test can be used) for the duration of the COVID-19 declaration under Section 564(b)(1) of the Act, 21 U.S.C. section 360bbb-3(b)(1), unless the authorization is terminated or revoked sooner.  Performed at [facility], [address] [place], [place] [address]   Culture, blood (routine x 2)     Status: None   Collection Time: 04/15/22  5:20 AM   Specimen: BLOOD  Result Value Ref Range Status   Specimen Description BLOOD LEFT HAND  Final   Special Requests   Final    BOTTLES DRAWN AEROBIC AND ANAEROBIC Blood Culture results may not be optimal due to an inadequate volume of blood received in culture bottles   Culture   Final    NO GROWTH 5 DAYS Performed at [facility], [address] [place], [place] [address]    Report Status 04/20/2022  FINAL  Final  Culture, blood (routine x 2)     Status: None   Collection Time: 04/15/22  7:40 AM   Specimen: BLOOD  Result Value Ref Range Status   Specimen Description BLOOD BLOOD LEFT FOREARM  Final   Special Requests   Final    BOTTLES DRAWN AEROBIC AND ANAEROBIC Blood Culture results may not be optimal due to an inadequate volume of blood received in culture bottles   Culture   Final    NO GROWTH 5 DAYS Performed at [facility], [address], [place], [place] [address]    Report Status 04/20/2022 FINAL  Final  Expectorated Sputum Assessment w Gram Stain, Rflx to Resp Cult     Status: None   Collection Time: 04/16/22  1:49 AM   Specimen: Sputum  Result Value Ref Range Status   Specimen Description SPUTUM  Final   Special Requests NONE  Final   Sputum evaluation   Final    THIS SPECIMEN IS ACCEPTABLE FOR SPUTUM CULTURE Performed at [facility], [address] [place], [place] [address]    Report Status 04/16/2022 FINAL  Final  Culture, Respiratory w Gram Stain     Status: None   Collection Time: 04/16/22  1:49 AM   Specimen: SPU  Result Value Ref Range Status   Specimen Description   Final    SPUTUM Performed at [facility], [address] [place], [place] [address]    Special Requests   Final    NONE Reflexed from [id number] Performed at [facility], [address] [place], [place] [address]    Gram Stain NO WBC SEEN NO ORGANISMS SEEN   Final   Culture   Final    RARE Normal respiratory flora-no Staph aureus or Pseudomonas seen Performed at [facility], [place] [address] [place], [place] [address]    Report Status 04/18/2022 FINAL  Final     Labs: BNP (last 3 results) Recent Labs    04/15/22 0402  BNP 62.7   Basic Metabolic Panel: No results for input(s): "NA", "K", "CL", "CO2", "GLUCOSE", "BUN", "CREATININE", "CALCIUM", "MG", "PHOS" in  the last 168 hours. Liver Function Tests: No results for input(s): "AST",  "ALT", "ALKPHOS", "BILITOT", "PROT", "ALBUMIN" in the last 168 hours. No results for input(s): "LIPASE", "AMYLASE" in the last 168 hours. No results for input(s): "AMMONIA" in the last 168 hours. CBC: No results for input(s): "WBC", "NEUTROABS", "HGB", "HCT", "MCV", "PLT" in the last 168 hours. Cardiac Enzymes: No results for input(s): "CKTOTAL", "CKMB", "CKMBINDEX", "TROPONINI" in the last 168 hours. BNP: Invalid input(s): "POCBNP" CBG: No results for input(s): "GLUCAP" in the last 168 hours. D-Dimer No results for input(s): "DDIMER" in the last 72 hours. Hgb A1c No results for input(s): "HGBA1C" in the last 72 hours. Lipid Profile No results for input(s): "CHOL", "HDL", "LDLCALC", "TRIG", "CHOLHDL", "LDLDIRECT" in the last 72 hours. Thyroid function studies No results for input(s): "TSH", "T4TOTAL", "T3FREE", "THYROIDAB" in the last 72 hours.  Invalid input(s): "FREET3" Anemia work up No results for input(s): "VITAMINB12", "FOLATE", "FERRITIN", "TIBC", "IRON", "RETICCTPCT" in the last 72 hours. Urinalysis No results found for: "COLORURINE", "APPEARANCEUR", "LABSPEC", "PHURINE", "GLUCOSEU", "HGBUR", "BILIRUBINUR", "KETONESUR", "PROTEINUR", "UROBILINOGEN", "NITRITE", "LEUKOCYTESUR" Sepsis Labs No results for input(s): "WBC" in the last 168 hours.  Invalid input(s): "PROCALCITONIN", "LACTICIDVEN" Microbiology Recent Results (from the past 240 hour(s))  SARS Coronavirus 2 by RT PCR (hospital order, performed in [facility] hospital lab) *cepheid single result test* Anterior Nasal Swab     Status: None   Collection Time: 04/15/22  5:20 AM   Specimen: Anterior Nasal Swab  Result Value Ref Range Status   SARS Coronavirus 2 by RT PCR NEGATIVE NEGATIVE Final    Comment: (NOTE) SARS-CoV-2 target nucleic acids are NOT DETECTED.  The SARS-CoV-2 RNA is generally detectable in upper and lower respiratory specimens during the acute phase of infection. The lowest concentration of  SARS-CoV-2 viral copies this assay can detect is 250 copies / mL. A negative result does not preclude SARS-CoV-2 infection and should not be used as the sole basis for treatment or other patient management decisions.  A negative result may occur with improper specimen collection / handling, submission of specimen other than nasopharyngeal swab, presence of viral mutation(s) within the areas targeted by this assay, and inadequate number of viral copies (<250 copies / mL). A negative result must be combined with clinical observations, patient history, and epidemiological information.  Fact Sheet for Patients:   RoadLapTop.co.za  Fact Sheet for Healthcare Providers: http://kim-miller.com/  This test is not yet approved or  cleared by the [place] FDA and has been authorized for detection and/or diagnosis of SARS-CoV-2 by FDA under an Emergency Use Authorization (EUA).  This EUA will remain in effect (meaning this test can be used) for the duration of the COVID-19 declaration under Section 564(b)(1) of the Act, 21 U.S.C. section 360bbb-3(b)(1), unless the authorization is terminated or revoked sooner.  Performed at [facility], [address] [place], [place] [address]   Culture, blood (routine x 2)     Status: None   Collection Time: 04/15/22  5:20 AM   Specimen: BLOOD  Result Value Ref Range Status   Specimen Description BLOOD LEFT HAND  Final   Special Requests   Final    BOTTLES DRAWN AEROBIC AND ANAEROBIC Blood Culture results may not be optimal due to an inadequate volume of blood received in culture bottles   Culture   Final    NO GROWTH 5 DAYS Performed at [facility], [address], [place], [place] [address]    Report Status 04/20/2022 FINAL  Final  Culture, blood (routine x 2)     Status: None   Collection Time: 04/15/22  7:40 AM   Specimen: BLOOD  Result Value Ref Range Status   Specimen  Description BLOOD BLOOD LEFT FOREARM  Final   Special Requests   Final    BOTTLES DRAWN AEROBIC AND ANAEROBIC Blood Culture results may not be optimal due to an inadequate volume of blood received in culture bottles   Culture   Final    NO GROWTH 5 DAYS Performed at [facility], [address] [place], [place] [address]    Report Status 04/20/2022 FINAL  Final  Expectorated Sputum Assessment w Gram Stain, Rflx to Resp Cult     Status: None   Collection Time: 04/16/22  1:49 AM   Specimen: Sputum  Result Value Ref Range Status   Specimen Description SPUTUM  Final   Special Requests NONE  Final   Sputum evaluation   Final    THIS SPECIMEN IS ACCEPTABLE FOR SPUTUM CULTURE Performed at [facility], [address] [place], [place] [address]    Report Status 04/16/2022 FINAL  Final  Culture, Respiratory w Gram Stain     Status: None   Collection Time: 04/16/22  1:49 AM   Specimen: SPU  Result Value Ref Range Status   Specimen Description   Final    SPUTUM Performed at [facility], [address], [place], [place] [address]    Special Requests   Final    NONE Reflexed from [id number] Performed at [facility], [address] [place], [place] [address]    Gram Stain NO WBC SEEN NO ORGANISMS SEEN   Final   Culture   Final    RARE Normal respiratory flora-no Staph aureus or Pseudomonas seen Performed at [facility], [place] [address], [place],  [address]    Report Status 04/18/2022 FINAL  Final     Time coordinating discharge: Over 30 minutes  SIGNED:   Wyvonnia Dusky, MD  [facility] 04/24/2022, 5:33 PM Pager   If 7PM-7AM, please contact night-coverage

## 2022-05-23 ENCOUNTER — Other Ambulatory Visit: Payer: Self-pay

## 2022-05-23 ENCOUNTER — Emergency Department: Payer: Medicare Other

## 2022-05-23 ENCOUNTER — Observation Stay
Admission: EM | Admit: 2022-05-23 | Discharge: 2022-05-23 | Disposition: A | Discharge status: Home / Self Care | Payer: Medicare Other | Attending: Family Medicine | Admitting: Family Medicine

## 2022-05-23 ENCOUNTER — Encounter: Payer: Self-pay | Admitting: Emergency Medicine

## 2022-05-23 DIAGNOSIS — Z87891 Personal history of nicotine dependence: Secondary | ICD-10-CM | POA: Diagnosis not present

## 2022-05-23 DIAGNOSIS — I2510 Atherosclerotic heart disease of native coronary artery without angina pectoris: Secondary | ICD-10-CM | POA: Insufficient documentation

## 2022-05-23 DIAGNOSIS — Z79899 Other long term (current) drug therapy: Secondary | ICD-10-CM | POA: Diagnosis not present

## 2022-05-23 DIAGNOSIS — I10 Essential (primary) hypertension: Secondary | ICD-10-CM | POA: Insufficient documentation

## 2022-05-23 DIAGNOSIS — R260 Ataxic gait: Secondary | ICD-10-CM | POA: Insufficient documentation

## 2022-05-23 DIAGNOSIS — R1032 Left lower quadrant pain: Secondary | ICD-10-CM | POA: Diagnosis not present

## 2022-05-23 DIAGNOSIS — Z20822 Contact with and (suspected) exposure to covid-19: Secondary | ICD-10-CM | POA: Insufficient documentation

## 2022-05-23 DIAGNOSIS — R0602 Shortness of breath: Secondary | ICD-10-CM | POA: Diagnosis not present

## 2022-05-23 DIAGNOSIS — R531 Weakness: Principal | ICD-10-CM | POA: Insufficient documentation

## 2022-05-23 DIAGNOSIS — Z961 Presence of intraocular lens: Secondary | ICD-10-CM | POA: Insufficient documentation

## 2022-05-23 DIAGNOSIS — I4891 Unspecified atrial fibrillation: Secondary | ICD-10-CM | POA: Diagnosis not present

## 2022-05-23 DIAGNOSIS — Z7982 Long term (current) use of aspirin: Secondary | ICD-10-CM | POA: Diagnosis not present

## 2022-05-23 LAB — BRAIN NATRIURETIC PEPTIDE: B Natriuretic Peptide: 124.1 pg/mL — ABNORMAL HIGH (ref 0.0–100.0)

## 2022-05-23 LAB — COMPREHENSIVE METABOLIC PANEL
ALT: 30 U/L (ref 0–44)
AST: 39 U/L (ref 15–41)
Albumin: 4.1 g/dL (ref 3.5–5.0)
Alkaline Phosphatase: 88 U/L (ref 38–126)
Anion gap: 9 (ref 5–15)
BUN: 20 mg/dL (ref 8–23)
CO2: 24 mmol/L (ref 22–32)
Calcium: 9 mg/dL (ref 8.9–10.3)
Chloride: 105 mmol/L (ref 98–111)
Creatinine, Ser: 1.09 mg/dL (ref 0.61–1.24)
GFR, Estimated: >60 mL/min (ref >60)
Glucose, Bld: 174 mg/dL — ABNORMAL HIGH (ref 70–99)
Potassium: 4.3 mmol/L (ref 3.5–5.1)
Sodium: 138 mmol/L (ref 135–145)
Total Bilirubin: 0.6 mg/dL (ref 0.3–1.2)
Total Protein: 7 g/dL (ref 6.5–8.1)

## 2022-05-23 LAB — TYPE AND SCREEN
ABO/RH(D): B POS
Antibody Screen: NEGATIVE

## 2022-05-23 LAB — RESP PANEL BY RT-PCR (FLU A&B, COVID) ARPGX2
Influenza A by PCR: NEGATIVE
Influenza B by PCR: NEGATIVE
SARS Coronavirus 2 by RT PCR: NEGATIVE

## 2022-05-23 LAB — TROPONIN I (HIGH SENSITIVITY)
Troponin I (High Sensitivity): 6 ng/L (ref <18)
Troponin I (High Sensitivity): 6 ng/L (ref <18)
Troponin I (High Sensitivity): 6 ng/L (ref <18)

## 2022-05-23 LAB — URINALYSIS, ROUTINE W REFLEX MICROSCOPIC
Bilirubin Urine: NEGATIVE
Glucose, UA: NEGATIVE mg/dL
Hgb urine dipstick: NEGATIVE
Ketones, ur: NEGATIVE mg/dL
Leukocytes,Ua: NEGATIVE
Nitrite: NEGATIVE
Protein, ur: NEGATIVE mg/dL
Specific Gravity, Urine: >1.046 — ABNORMAL HIGH (ref 1.005–1.030)
pH: 6 (ref 5.0–8.0)

## 2022-05-23 LAB — CBC
HCT: 45.4 % (ref 39.0–52.0)
Hemoglobin: 14.3 g/dL (ref 13.0–17.0)
MCH: 26 pg (ref 26.0–34.0)
MCHC: 31.5 g/dL (ref 30.0–36.0)
MCV: 82.7 fL (ref 80.0–100.0)
Platelets: 478 10*3/uL — ABNORMAL HIGH (ref 150–400)
RBC: 5.49 MIL/uL (ref 4.22–5.81)
RDW: 15 % (ref 11.5–15.5)
WBC: 19.9 10*3/uL — ABNORMAL HIGH (ref 4.0–10.5)
nRBC: 0 % (ref 0.0–0.2)

## 2022-05-23 LAB — PROTIME-INR
INR: 1.1 (ref 0.8–1.2)
Prothrombin Time: 14 seconds (ref 11.4–15.2)

## 2022-05-23 LAB — T4, FREE: Free T4: 0.62 ng/dL (ref 0.61–1.12)

## 2022-05-23 LAB — MAGNESIUM: Magnesium: 2.4 mg/dL (ref 1.7–2.4)

## 2022-05-23 LAB — TSH: TSH: 1.237 u[IU]/mL (ref 0.350–4.500)

## 2022-05-23 MED ORDER — DILTIAZEM LOAD VIA INFUSION
10.0000 mg | Freq: Once | INTRAVENOUS | Status: AC
  Administered 2022-05-23: 10 mg via INTRAVENOUS
  Filled 2022-05-23: qty 10

## 2022-05-23 MED ORDER — TAMSULOSIN HCL 0.4 MG PO CAPS: 0.4000 mg | ORAL_CAPSULE | Freq: Every day | ORAL | Status: DC

## 2022-05-23 MED ORDER — ONDANSETRON HCL 4 MG/2ML IJ SOLN: 4.0000 mg | Freq: Four times a day (QID) | INTRAMUSCULAR | Status: DC | PRN

## 2022-05-23 MED ORDER — IOHEXOL 300 MG/ML  SOLN
100.0000 mL | Freq: Once | INTRAMUSCULAR | Status: AC | PRN
  Administered 2022-05-23: 100 mL via INTRAVENOUS

## 2022-05-23 MED ORDER — SODIUM CHLORIDE 0.9 % IV BOLUS
500.0000 mL | Freq: Once | INTRAVENOUS | Status: AC
  Administered 2022-05-23: 500 mL via INTRAVENOUS

## 2022-05-23 MED ORDER — DILTIAZEM HCL 60 MG PO TABS
30.0000 mg | ORAL_TABLET | Freq: Once | ORAL | Status: AC
  Administered 2022-05-23: 30 mg via ORAL
  Filled 2022-05-23: qty 1

## 2022-05-23 MED ORDER — CARVEDILOL 6.25 MG PO TABS
6.2500 mg | ORAL_TABLET | Freq: Two times a day (BID) | ORAL | Status: DC
  Administered 2022-05-23: 6.25 mg via ORAL
  Filled 2022-05-23: qty 1

## 2022-05-23 MED ORDER — ASPIRIN 81 MG PO TBEC
81.0000 mg | DELAYED_RELEASE_TABLET | Freq: Every day | ORAL | Status: DC
Start: 2022-05-24 — End: 2022-05-23

## 2022-05-23 MED ORDER — DILTIAZEM HCL-DEXTROSE 125-5 MG/125ML-% IV SOLN (PREMIX)
5.0000 mg/h | INTRAVENOUS | Status: DC
  Administered 2022-05-23: 5 mg/h via INTRAVENOUS

## 2022-05-23 MED ORDER — DILTIAZEM HCL-DEXTROSE 125-5 MG/125ML-% IV SOLN (PREMIX)
5.0000 mg/h | INTRAVENOUS | Status: DC
  Administered 2022-05-23: 5 mg/h via INTRAVENOUS
  Filled 2022-05-23: qty 125

## 2022-05-23 MED ORDER — NITROGLYCERIN 0.4 MG SL SUBL: 0.4000 mg | SUBLINGUAL_TABLET | SUBLINGUAL | Status: DC | PRN

## 2022-05-23 MED ORDER — POTASSIUM CHLORIDE CRYS ER 20 MEQ PO TBCR: 20.0000 meq | EXTENDED_RELEASE_TABLET | Freq: Every day | ORAL | Status: DC

## 2022-05-23 MED ORDER — FLUTICASONE PROPIONATE 50 MCG/ACT NA SUSP: 1.0000 | Freq: Two times a day (BID) | NASAL | Status: DC | PRN

## 2022-05-23 MED ORDER — ACETAMINOPHEN 325 MG PO TABS: 650.0000 mg | ORAL_TABLET | ORAL | Status: DC | PRN

## 2022-05-23 MED ORDER — FUROSEMIDE 40 MG PO TABS: 40.0000 mg | ORAL_TABLET | Freq: Every day | ORAL | Status: DC

## 2022-05-23 MED ORDER — PANTOPRAZOLE SODIUM 40 MG PO TBEC: 40.0000 mg | DELAYED_RELEASE_TABLET | Freq: Every day | ORAL | Status: DC

## 2022-05-23 MED ORDER — CARVEDILOL 6.25 MG PO TABS: 6.2500 mg | ORAL_TABLET | Freq: Two times a day (BID) | ORAL | Status: DC

## 2022-05-23 NOTE — ED Provider Notes (Signed)
[facility] Provider Note    Event Date/Time   First MD Initiated Contact with Patient 05/23/22 1520     (approximate)   History   Melena   HPI  Allen Terry is a 86 y.o. male extensive past medical history including A-fib as well as CHF CAD not on anticoagulation due to history of recurrent GI bleeds presents to the ER for evaluation of generalized weakness and unsteadiness as well as some pink-tinged stools over the past 2 weeks.  States he is also noted some lower extremity swelling.  States he feels swimmy headed with ambulation and feels he is about to fall over.  He lives at home alone.  Was admitted to the hospital at this facility in June for pneumonia but left AGAINST MEDICAL ADVICE.     Physical Exam   Triage Vital Signs: ED Triage Vitals [05/23/22 1404]  Enc Vitals Group     BP 122/84     Pulse Rate (!) 45     Resp 17     Temp 97.9 F (36.6 C)     Temp src      SpO2 96 %     Weight      Height      Head Circumference      Peak Flow      Pain Score 0     Pain Loc      Pain Edu?      Excl. in GC?     Most recent vital signs: Vitals:   05/23/22 1900 05/23/22 1920  BP: 139/71   Pulse: 70   Resp: 17   Temp:  98.1 F (36.7 C)  SpO2: 97%      Constitutional: Alert  Eyes: Conjunctivae are normal.  Head: Atraumatic. Nose: No congestion/rhinnorhea. Mouth/Throat: Mucous membranes are moist.   Neck: Painless ROM.  Cardiovascular:   Good peripheral circulation. Respiratory: Normal respiratory effort.  No retractions.  Gastrointestinal: Soft with mild llq pain Musculoskeletal:  no deformity Neurologic:  MAE spontaneously. No gross focal neurologic deficits are appreciated.  Skin:  Skin is warm, dry and intact. No rash noted. Psychiatric: Mood and affect are normal. Speech and behavior are normal.    ED Results / Procedures / Treatments   Labs (all labs ordered are listed, but only abnormal results are displayed) Labs  Reviewed  COMPREHENSIVE METABOLIC PANEL - Abnormal; Notable for the following components:      Result Value   Glucose, Bld 174 (*)    All other components within normal limits  CBC - Abnormal; Notable for the following components:   WBC 19.9 (*)    Platelets 478 (*)    All other components within normal limits  URINALYSIS, ROUTINE W REFLEX MICROSCOPIC - Abnormal; Notable for the following components:   Color, Urine YELLOW (*)    APPearance CLEAR (*)    Specific Gravity, Urine >1.046 (*)    All other components within normal limits  RESP PANEL BY RT-PCR (FLU A&B, COVID) ARPGX2  PROTIME-INR  POC OCCULT BLOOD, ED  TYPE AND SCREEN  TROPONIN I (HIGH SENSITIVITY)  TROPONIN I (HIGH SENSITIVITY)     EKG  ED ECG REPORT I, Willy Eddy, the attending physician, personally viewed and interpreted this ECG.   Date: 05/23/2022  EKG Time: 16:03  Rate: 120  Rhythm: afib with rvr  Axis: normal  Intervals:normalqt  ST&T Change: nonspecific st abn, no stemi    RADIOLOGY Please see ED Course for my review  and interpretation.  I personally reviewed all radiographic images ordered to evaluate for the above acute complaints and reviewed radiology reports and findings.  These findings were personally discussed with the patient.  Please see medical record for radiology report.    PROCEDURES:  Critical Care performed: Yes, see critical care procedure note(s)  .Critical Care  Performed by: Willy Eddy, MD Authorized by: Willy Eddy, MD   Critical care provider statement:    Critical care time (minutes):  35   Critical care was necessary to treat or prevent imminent or life-threatening deterioration of the following conditions:  Circulatory failure   Critical care was time spent personally by me on the following activities:  Ordering and performing treatments and interventions, ordering and review of laboratory studies, ordering and review of radiographic studies, pulse  oximetry, re-evaluation of patient's condition, review of old charts, obtaining history from patient or surrogate, examination of patient, evaluation of patient's response to treatment, discussions with primary provider, discussions with consultants and development of treatment plan with patient or surrogate    MEDICATIONS ORDERED IN ED: Medications  carvedilol (COREG) tablet 6.25 mg (6.25 mg Oral Given 05/23/22 1807)  diltiazem (CARDIZEM) 1 mg/mL load via infusion 10 mg (10 mg Intravenous Bolus from Bag 05/23/22 1837)    And  diltiazem (CARDIZEM) 125 mg in dextrose 5% 125 mL (1 mg/mL) infusion (5 mg/hr Intravenous New Bag/Given 05/23/22 1836)  iohexol (OMNIPAQUE) 300 MG/ML solution 100 mL (100 mLs Intravenous Contrast Given 05/23/22 1650)  diltiazem (CARDIZEM) tablet 30 mg (30 mg Oral Given 05/23/22 1808)  sodium chloride 0.9 % bolus 500 mL (0 mLs Intravenous Stopped 05/23/22 1901)     IMPRESSION / MDM / ASSESSMENT AND PLAN / ED COURSE  I reviewed the triage vital signs and the nursing notes.                              Differential diagnosis includes, but is not limited to, Dehydration, sepsis, pna, uti, hypoglycemia, cva, drug effect, withdrawal, afib with rvr  Patient presented to the ER for evaluation of symptoms as described above.  This presenting complaint could reflect a potentially life-threatening illness therefore the patient will be placed on continuous pulse oximetry and telemetry for monitoring.  Laboratory evaluation will be sent to evaluate for the above complaints.      Clinical Course as of 05/23/22 1928  Thu May 23, 2022  1723 Work-up is otherwise unremarkable does have evidence of leukocytosis uncertain etiology.  He is denying any chest pain right now we will add on troponin.  Given the dizziness and unsteadiness will order MRI. [PR]  1819 Patient's rectal exam with soft formed brown stool that is guaiac negative.  Patient is in A-fib with RVR heart rates in the 150s to  160s.  Patient is refusing MRI stating that he does not believe that his symptoms could be related to CVA.  Spent quite a great deal of time in the room trying to explain my reasoning for ordering MRI and his increased risk for stroke given his underlying A-fib not on anticoagulation.  Nevertheless the patient is still declining this. [PR]  1847 Heart rate now improved to the 120s after IV Cardizem bolus. [PR]  1859 Heart rate now in the 110's.  Having spoken to them several times during this visit I am concerned for some underlying confusion and possible dementia.  He lives at home alone.  Question compliance with his  medications.  He states that he is compliant but admits to being forgetful at times.  This point I do believe he is stable and appropriate for admission to the hospital for observation on Cardizem infusion. [PR]    Clinical Course User Index [PR] Willy Eddy, MD     FINAL CLINICAL IMPRESSION(S) / ED DIAGNOSES   Final diagnoses:  Atrial fibrillation with RVR (HCC)     Rx / DC Orders   ED Discharge Orders     None        Note:  This document was prepared using Dragon voice recognition software and may include unintentional dictation errors.    Willy Eddy, MD 05/23/22 Serena Croissant

## 2022-05-23 NOTE — ED Notes (Signed)
Pt refused blood draw from RN and wants to lab to draw his blood work. Lab contacted and is coming to collect his blood work.

## 2022-05-23 NOTE — ED Notes (Signed)
Pt calling out. This tech went in rm to assist pt. Pt stated to this tech, "unhook me, I am going to leave." Pt made aware that I will inform his RN. [place], RN made aware and will talk to pt.

## 2022-05-23 NOTE — ED Notes (Signed)
Refuses DC temp and vitals

## 2022-05-23 NOTE — ED Notes (Signed)
IV team RN at bedside. Pt went to xray and CT already.

## 2022-05-23 NOTE — ED Notes (Signed)
Pt is stil a fib with variable HR. EDP is at bedside. Pt is refusing MRI but came to ER because feels dizzy and weak. HR is mostly in 140s.

## 2022-05-23 NOTE — ED Notes (Signed)
EDP at bedside talking with pt. Pt adamently states he refuses MRI.

## 2022-05-23 NOTE — ED Notes (Signed)
Pt to CT now

## 2022-05-23 NOTE — ED Triage Notes (Signed)
Pt reports red stool x 2 weeks. Pt also reports SHOB. Pt reports around 1 year ago he had an EGD "where they clipped some things in my colon."

## 2022-05-23 NOTE — ED Notes (Addendum)
Lab at bedside to draw blue top. Triage was unable to place IV/get blood. IV team consult placed. This RN and other RN unable to get IV.

## 2022-05-23 NOTE — ED Notes (Signed)
Pt to ED for red-pink blood tinged stools for 1 month.

## 2022-05-23 NOTE — H&P (Signed)
History and Physical   [facility] -  @ [facility] Admission History and Physical [employer], D.O.    Patient Name: Allen Terry MR#: [id number] Date of Birth: [birth year] Date of Admission: 05/23/2022  Referring MD/NP/PA: Dr. Roxan Hockey Primary Care Physician: Pcp, No  Chief Complaint:  Chief Complaint  Patient presents with   Melena    HPI: Allen Terry is a 86 y.o. male with a known history of atrial fibrillation not currently on anticoagulation due to recurrent lower GI bleeding, coronary artery disease, hypertension, hyperlipidemia, GERD, BPH  presents to the emergency department for evaluation of multiple complaints including lower extremity swelling, unsteady gait, generalized weakness and pink stools for the past 2 weeks.  Patient lives at home alone but states that he is functionally independent and compliant with his medications  Patient denies fevers/chills, dizziness, chest pain, shortness of breath, N/V/C/D, abdominal pain, dysuria/frequency, changes in mental status.    Otherwise there has been no change in status. Patient has been taking medication as prescribed and there has been no recent change in medication or diet.  No recent antibiotics.  There has been no recent illness, hospitalizations, travel or sick contacts.    EMS/ED Course: Patient received Cardizem bolus and drip, Coreg, 500 cc normal saline bolus. Medical admission has been requested for further management of atrial fibrillation with RVR, ataxia.  MRI was ordered to further evaluate for stroke however patient refused.  States that he recently had an echo and didn't come here to be evaluated for stroke  Review of Systems:  CONSTITUTIONAL: Positive weakness no fever/chills, fatigue, weight gain/loss, headache. EYES: No blurry or double vision. ENT: No tinnitus, postnasal drip, redness or soreness of the oropharynx. RESPIRATORY: No cough, dyspnea, wheeze.  No hemoptysis.  CARDIOVASCULAR: No  chest pain, palpitations, syncope, orthopnea. No lower extremity edema.  GASTROINTESTINAL: Positive pink-tinged stools.  No nausea, vomiting, abdominal pain, diarrhea, constipation.  No hematemesis, melena or hematochezia. GENITOURINARY: No dysuria, frequency, hematuria. ENDOCRINE: No polyuria or nocturia. No heat or cold intolerance. HEMATOLOGY: No anemia, bruising, bleeding. INTEGUMENTARY: No rashes, ulcers, lesions. MUSCULOSKELETAL: No arthritis, gout,. NEUROLOGIC: Positive ataxia-unsteady gait.  No numbness, tingling, seizure-type activity, weakness. PSYCHIATRIC: No anxiety, depression, insomnia.   Past Medical History:  Diagnosis Date   Acute lower GI bleeding 2015   "related to blood thinners"   Anemia    Bursitis of both shoulders    CAD (coronary artery disease)    a. s/p PCI 01/17/2011 85% proximal RCA with DES. b. PCI 09/05/2014 60% mid LAD lesion, with DES  b. Cath 02/13/2016 80% ost OM2 treated with 2.7514 mm Resolute DES. d. Cath 12/2018 s/p DES to prox-mid LAD with residual 70% diagonal disease for medical therapy, EF 55-65%.   HTN (hypertension)    "not anymore" (02/12/2016)   Hyperlipidemia    Noncompliance    Pneumonia    "double"   Pre-diabetes    Tuberculosis    "I've got 25% somewhere in my body" (02/12/2016)    Past Surgical History:  Procedure Laterality Date   CARDIAC CATHETERIZATION N/A 02/13/2016   Procedure: Left Heart Cath and Coronary Angiography;  Surgeon: Lennette Bihari, MD;  Location: [facility] INVASIVE CV LAB;  Service: Cardiovascular;  Laterality: N/A;   CARDIAC CATHETERIZATION N/A 02/13/2016   Procedure: Coronary Stent Intervention;  Surgeon: Lennette Bihari, MD;  Location: [facility] INVASIVE CV LAB;  Service: Cardiovascular;  Laterality: N/A;   CATARACT EXTRACTION W/ INTRAOCULAR LENS  IMPLANT, BILATERAL Bilateral    CORONARY ANGIOPLASTY  WITH STENT PLACEMENT     s/p PCI 01/17/2011 85% proximal RCA with DES, PCI 09/05/2014 60% mid LAD lesion, with DES    CORONARY STENT  INTERVENTION N/A 01/01/2019   Procedure: CORONARY STENT INTERVENTION;  Surgeon: Corky Crafts, MD;  Location: [facility] INVASIVE CV LAB;  Service: Cardiovascular;  Laterality: N/A;   LEFT HEART CATH AND CORONARY ANGIOGRAPHY N/A 01/01/2019   Procedure: LEFT HEART CATH AND CORONARY ANGIOGRAPHY;  Surgeon: Corky Crafts, MD;  Location: [facility] INVASIVE CV LAB;  Service: Cardiovascular;  Laterality: N/A;   TONSILLECTOMY     UPPER GI ENDOSCOPY     s/p GI bleed related to Brilenta     reports that he has quit smoking. His smoking use included cigarettes. He has never used smokeless tobacco. He reports current alcohol use. He reports that he does not use drugs.  Allergies  Allergen Reactions   Ferrous Sulfate Other (See Comments)    Face burning   Meclizine Other (See Comments)    Burning in face when taking iron   Metoprolol Other (See Comments)    abd pain   Pravastatin Other (See Comments)    Myalgias, even when combined with coenzyme q 10   Ticagrelor Other (See Comments)    Cough, sneezing and nasal congestion   Lisinopril Cough   Penicillins Hives and Rash    Did it involve swelling of the face/tongue/throat, SOB, or low BP? No Did it involve sudden or severe rash/hives, skin peeling, or any reaction on the inside of your mouth or nose? No Did you need to seek medical attention at a hospital or doctor's office? Yes When did it last happen?   40 years    If all above answers are "NO", may proceed with cephalosporin use.    Family History  Problem Relation Age of Onset   Arrhythmia Mother    Arrhythmia Sister    Stroke Father    Hypertension Brother     Prior to Admission medications   Medication Sig Start Date End Date Taking? Authorizing Provider  acetaminophen (TYLENOL) 500 MG tablet Take 1,000 mg by mouth every 6 (six) hours as needed for mild pain. Patient not taking: Reported on 04/15/2022    [provider]  aspirin EC 81 MG tablet Take 81 mg by mouth daily.      [provider]  carvedilol (COREG) 6.25 MG tablet Take 1 tablet (6.25 mg total) by mouth 2 (two) times daily with a meal. Patient not taking: Reported on 04/15/2022 12/26/20   Marinda Elk, MD  clobetasol cream (TEMOVATE) 0.05 % Apply 1 application topically 2 (two) times daily. 12/18/20   [provider]  diclofenac Sodium (VOLTAREN) 1 % GEL Apply 1 application topically 4 (four) times daily. 10/31/20   [provider]  diltiazem (DILACOR XR) 120 MG 24 hr capsule Take 120 mg by mouth daily.    [provider]  fluticasone (FLONASE) 50 MCG/ACT nasal spray Place 1 spray into both nostrils 2 (two) times daily as needed for allergies. 11/20/20   [provider]  furosemide (LASIX) 20 MG tablet Take 2 tablets by mouth daily. 01/24/21   [provider]  hydrochlorothiazide (HYDRODIURIL) 25 MG tablet Take 25 mg by mouth daily. Patient not taking: Reported on 04/15/2022 10/27/20   [provider]  isosorbide mononitrate (IMDUR) 30 MG 24 hr tablet Take 1 tablet (30 mg total) by mouth daily. Patient not taking: Reported on 04/15/2022 12/26/20   Marinda Elk,  MD  nitroGLYCERIN (NITROSTAT) 0.4 MG SL tablet Place 1 tablet (0.4 mg total) under the tongue every 5 (five) minutes x 3 doses as needed for chest pain. 02/14/16   Azalee CourseMeng, Hao, PA  pantoprazole (PROTONIX) 40 MG tablet Take 40 mg by mouth daily.    [provider]  potassium chloride SA (KLOR-CON) 20 MEQ tablet Take 20 mEq by mouth daily. 10/23/20   [provider]  tamsulosin (FLOMAX) 0.4 MG CAPS capsule Take 0.4 mg by mouth daily. 10/28/20   [provider]    Physical Exam: Vitals:   05/23/22 1803 05/23/22 1808 05/23/22 1900 05/23/22 1920  BP: 134/74 134/74 139/71   Pulse:   70   Resp:   17   Temp:    98.1 F (36.7 C)  TempSrc:    Oral  SpO2:   97%     GENERAL: 86 y.o.-year-old male patient, well-developed, well-nourished lying in the bed in no acute  distress.   HEENT: Head atraumatic, normocephalic. Mucus membranes moist. NECK: Supple. No JVD. CHEST: Normal breath sounds bilaterally. No wheezing, rales, rhonchi or crackles. No use of accessory muscles of respiration.  No reproducible chest wall tenderness.  CARDIOVASCULAR: S1, S2 normal. No murmurs, rubs, or gallops. Cap refill <2 seconds. Pulses intact distally.  ABDOMEN: Soft, nondistended, nontender. No rebound, guarding, rigidity. Normoactive bowel sounds present in all four quadrants.  EXTREMITIES: Mid nonpitting edema B/L LE.  NEUROLOGIC: The patient is alert and oriented x 3. Cranial nerves II through XII are grossly intact with no focal sensorimotor deficit. PSYCHIATRIC:  Normal affect, mood, thought content. SKIN: Warm, dry, and intact without obvious rash, lesion, or ulcer.  Scattered ecchymoses.     Labs on Admission:  CBC: Recent Labs  Lab 05/23/22 1445  WBC 19.9*  HGB 14.3  HCT 45.4  MCV 82.7  PLT 478*   Basic Metabolic Panel: Recent Labs  Lab 05/23/22 1445  NA 138  K 4.3  CL 105  CO2 24  GLUCOSE 174*  BUN 20  CREATININE 1.09  CALCIUM 9.0   GFR: CrCl cannot be calculated (Unknown ideal weight.). Liver Function Tests: Recent Labs  Lab 05/23/22 1445  AST 39  ALT 30  ALKPHOS 88  BILITOT 0.6  PROT 7.0  ALBUMIN 4.1   No results for input(s): "LIPASE", "AMYLASE" in the last 168 hours. No results for input(s): "AMMONIA" in the last 168 hours. Coagulation Profile: Recent Labs  Lab 05/23/22 1545  INR 1.1   Cardiac Enzymes: No results for input(s): "CKTOTAL", "CKMB", "CKMBINDEX", "TROPONINI" in the last 168 hours. BNP (last 3 results) No results for input(s): "PROBNP" in the last 8760 hours. HbA1C: No results for input(s): "HGBA1C" in the last 72 hours. CBG: No results for input(s): "GLUCAP" in the last 168 hours. Lipid Profile: No results for input(s): "CHOL", "HDL", "LDLCALC", "TRIG", "CHOLHDL", "LDLDIRECT" in the last 72 hours. Thyroid  Function Tests: No results for input(s): "TSH", "T4TOTAL", "FREET4", "T3FREE", "THYROIDAB" in the last 72 hours. Anemia Panel: No results for input(s): "VITAMINB12", "FOLATE", "FERRITIN", "TIBC", "IRON", "RETICCTPCT" in the last 72 hours. Urine analysis:    Component Value Date/Time   COLORURINE YELLOW (A) 05/23/2022 1729   APPEARANCEUR CLEAR (A) 05/23/2022 1729   LABSPEC >1.046 (H) 05/23/2022 1729   PHURINE 6.0 05/23/2022 1729   GLUCOSEU NEGATIVE 05/23/2022 1729   HGBUR NEGATIVE 05/23/2022 1729   BILIRUBINUR NEGATIVE 05/23/2022 1729   KETONESUR NEGATIVE 05/23/2022 1729   PROTEINUR NEGATIVE 05/23/2022 1729   NITRITE NEGATIVE 05/23/2022 1729  LEUKOCYTESUR NEGATIVE 05/23/2022 1729   Sepsis Labs: @LABRCNTIP (procalcitonin:4,lacticidven:4) ) Recent Results (from the past 240 hour(s))  Resp Panel by RT-PCR (Flu A&B, Covid) Anterior Nasal Swab     Status: None   Collection Time: 05/23/22  5:15 PM   Specimen: Anterior Nasal Swab  Result Value Ref Range Status   SARS Coronavirus 2 by RT PCR NEGATIVE NEGATIVE Final    Comment: (NOTE) SARS-CoV-2 target nucleic acids are NOT DETECTED.  The SARS-CoV-2 RNA is generally detectable in upper respiratory specimens during the acute phase of infection. The lowest concentration of SARS-CoV-2 viral copies this assay can detect is 138 copies/mL. A negative result does not preclude SARS-Cov-2 infection and should not be used as the sole basis for treatment or other patient management decisions. A negative result may occur with  improper specimen collection/handling, submission of specimen other than nasopharyngeal swab, presence of viral mutation(s) within the areas targeted by this assay, and inadequate number of viral copies(<138 copies/mL). A negative result must be combined with clinical observations, patient history, and epidemiological information. The expected result is Negative.  Fact Sheet for Patients:   05/25/22  Fact Sheet for Healthcare Providers:  BloggerCourse.com  This test is no t yet approved or cleared by the [employer] FDA and  has been authorized for detection and/or diagnosis of SARS-CoV-2 by FDA under an Emergency Use Authorization (EUA). This EUA will remain  in effect (meaning this test can be used) for the duration of the COVID-19 declaration under Section 564(b)(1) of the Act, 21 U.S.C.section 360bbb-3(b)(1), unless the authorization is terminated  or revoked sooner.       Influenza A by PCR NEGATIVE NEGATIVE Final   Influenza B by PCR NEGATIVE NEGATIVE Final    Comment: (NOTE) The Xpert Xpress SARS-CoV-2/FLU/RSV plus assay is intended as an aid in the diagnosis of influenza from Nasopharyngeal swab specimens and should not be used as a sole basis for treatment. Nasal washings and aspirates are unacceptable for Xpert Xpress SARS-CoV-2/FLU/RSV testing.  Fact Sheet for Patients: [place]  Fact Sheet for Healthcare Providers: BloggerCourse.com  This test is not yet approved or cleared by the [employer] FDA and has been authorized for detection and/or diagnosis of SARS-CoV-2 by FDA under an Emergency Use Authorization (EUA). This EUA will remain in effect (meaning this test can be used) for the duration of the COVID-19 declaration under Section 564(b)(1) of the Act, 21 U.S.C. section 360bbb-3(b)(1), unless the authorization is terminated or revoked.  Performed at [facility], [address] [place], [place]      Radiological Exams on Admission: CT ABDOMEN PELVIS W CONTRAST  Result Date: 05/23/2022 CLINICAL DATA:  LEFT lower quadrant abdominal pain with red stool for 2 weeks, shortness of breath EXAM: CT ABDOMEN AND PELVIS WITH CONTRAST TECHNIQUE: Multidetector CT imaging of the abdomen and pelvis was performed using  the standard protocol following bolus administration of intravenous contrast. RADIATION DOSE REDUCTION: This exam was performed according to the departmental dose-optimization program which includes automated exposure control, adjustment of the mA and/or kV according to patient size and/or use of iterative reconstruction technique. CONTRAST:  05/25/2022 OMNIPAQUE IOHEXOL 300 MG/ML SOLN IV. No oral contrast. COMPARISON:  06/26/2016 FINDINGS: Lower chest: Minimal bibasilar atelectasis greater on RIGHT Hepatobiliary: Calcified gallstones dependently in gallbladder. Liver unremarkable. No biliary dilatation or gallbladder wall thickening. Pancreas: Normal appearance Spleen: Normal appearance Adrenals/Urinary Tract: Adrenal glands, kidneys, ureters, and bladder normal appearance Stomach/Bowel: Normal appendix. Minimal diverticulosis of sigmoid colon without evidence  of diverticulitis. Stomach and bowel loops otherwise normal appearance. Vascular/Lymphatic: Atherosclerotic calcifications aorta, iliac arteries, femoral arteries, coronary arteries, and SMA. Aorta normal caliber. 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 No adenopathy. Reproductive: Unremarkable prostate gland and seminal vesicles Other: No free air or free fluid. Small umbilical hernia containing fat. Musculoskeletal: Osseous demineralization. IMPRESSION: Cholelithiasis. Minimal sigmoid diverticulosis without evidence of diverticulitis. Small umbilical hernia containing fat. 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 No acute intra-abdominal or intrapelvic abnormalities. Aortic Atherosclerosis (ICD10-I70.0). Electronically Signed   By: Ulyses Southward M.D.   On: 05/23/2022 17:09   CT HEAD WO CONTRAST ( )  Result Date: 05/23/2022 CLINICAL DATA:  Follow-up stroke. Shortness of breath. Red stools for 2 weeks. EXAM: CT HEAD WITHOUT CONTRAST TECHNIQUE: Contiguous axial images were obtained from the base of the  skull through the vertex without intravenous contrast. RADIATION DOSE REDUCTION: This exam was performed according to the departmental dose-optimization program which includes automated exposure control, adjustment of the mA and/or kV according to patient size and/or use of iterative reconstruction technique. COMPARISON:  06/26/2016 FINDINGS: Brain: Diffuse cerebral atrophy. Ventricular dilatation consistent with central atrophy. Low-attenuation changes in the deep white matter, pons, and midbrain consistent with small vessel ischemia. No abnormal extra-axial fluid collections. No mass effect or midline shift. Gray-white matter junctions are distinct. Basal cisterns are not effaced. No acute intracranial hemorrhage. Vascular: No hyperdense vessel or unexpected calcification. Skull: Normal. Negative for fracture or focal lesion. Sinuses/Orbits: No acute finding. Other: None. IMPRESSION: No acute intracranial abnormalities. Chronic atrophy and small vessel ischemic changes. Electronically Signed   By: Burman Nieves M.D.   On: 05/23/2022 17:07   DG Chest 2 View  Result Date: 05/23/2022 CLINICAL DATA:  Shortness of breath for 2 weeks. Coronary artery disease. EXAM: CHEST - 2 VIEW COMPARISON:  04/15/2022 FINDINGS: The heart size and mediastinal contours are within normal limits. Both lungs are clear. The visualized skeletal structures are unremarkable. IMPRESSION: No active cardiopulmonary disease. Electronically Signed   By: Danae Orleans M.D.   On: 05/23/2022 16:39    EKG: Atrial fibrillation at 120 bpm  Assessment/Plan  This is a 86 y.o. male with a history of atrial fibrillation not currently on anticoagulation due to recurrent lower GI bleeding, coronary artery disease, hypertension, hyperlipidemia, GERD, BPH  now being admitted with:  #.  Atrial fibrillation with RVR - Admit to inpatient telemetry - Continue titrating diltiazem drip with conversion to oral therapy - Continue aspirin, Coreg -  Anticoagulation contraindicated secondary to recurrent GI bleeds due to [facility] in the past - Check TSH, troponins - Check echo  #.  Ataxia, generalized weakness, rule out CVA -Patient is refusing MRI and echo - states that he did not come here for CVA workup.  - Check carotids - Neurochecks every 4 hours - Ambulate with assistance - Check lipids  #.  Concern for GI bleed with reported blood-tinged stools - FOBT negative in the emergency department and CBC stable - Check FOBT  #. History of hypertension - Continue Lasix  #. History of CAD - Continue Imdur, nitroglycerin  #. History of GERD - Continue Protonix  #. History of BPH - Continue Flomax  #. History of hyperlipidemia -Unable to take statins due to myalgias - Start Zetia  #.  PT evaluation and social work consult regarding safe home discharge planning  Admission status: Inpatient telemetry IV Fluids: Hep-Lock Diet/Nutrition: Heart healthy Consults called: Cardiology, PT, social work DVT Px: SCDs  and early ambulation. Code Status: Full Code  Disposition Plan: To home in 1-2 days  All the records are reviewed and case discussed with ED provider. Management plans discussed with the patient and/or family who express understanding and agree with plan of care.  Marice Angelino D.O. on 05/23/2022 at 7:31 PM CC: Primary care physician; Pcp, No   05/23/2022, 7:31 PM

## 2022-05-23 NOTE — ED Notes (Signed)
Pt states that he is leaving, demonstrates that he is A&Ox4. Raises voice to this RN while trying to explain the benefits of staying and why staying in the hospital has been recommended. Pt demands to have IV removed and monitoring equipment removed. This is done. Pt refuses to sign any AMA paperwork though requested repeatedly. Refuses to stay and speak to any additional staff.

## 2024-01-06 ENCOUNTER — Emergency Department

## 2024-01-06 ENCOUNTER — Emergency Department
Admission: EM | Admit: 2024-01-06 | Discharge: 2024-01-06 | Disposition: A | Discharge status: Home / Self Care | Attending: Emergency Medicine | Admitting: Emergency Medicine

## 2024-01-06 ENCOUNTER — Other Ambulatory Visit: Payer: Self-pay

## 2024-01-06 DIAGNOSIS — R109 Unspecified abdominal pain: Secondary | ICD-10-CM | POA: Insufficient documentation

## 2024-01-06 DIAGNOSIS — R10A0 Flank pain, unspecified side: Secondary | ICD-10-CM

## 2024-01-06 DIAGNOSIS — J189 Pneumonia, unspecified organism: Secondary | ICD-10-CM | POA: Diagnosis not present

## 2024-01-06 LAB — URINALYSIS, ROUTINE W REFLEX MICROSCOPIC
Bilirubin Urine: NEGATIVE
Glucose, UA: NEGATIVE mg/dL
Hgb urine dipstick: NEGATIVE
Ketones, ur: NEGATIVE mg/dL
Leukocytes,Ua: NEGATIVE
Nitrite: NEGATIVE
Protein, ur: NEGATIVE mg/dL
Specific Gravity, Urine: 1.009 (ref 1.005–1.030)
pH: 5 (ref 5.0–8.0)

## 2024-01-06 LAB — BASIC METABOLIC PANEL
Anion gap: 9 (ref 5–15)
BUN: 6 mg/dL — ABNORMAL LOW (ref 8–23)
CO2: 22 mmol/L (ref 22–32)
Calcium: 8.9 mg/dL (ref 8.9–10.3)
Chloride: 105 mmol/L (ref 98–111)
Creatinine, Ser: 0.95 mg/dL (ref 0.61–1.24)
GFR, Estimated: >60 mL/min (ref >60)
Glucose, Bld: 121 mg/dL — ABNORMAL HIGH (ref 70–99)
Potassium: 3.8 mmol/L (ref 3.5–5.1)
Sodium: 136 mmol/L (ref 135–145)

## 2024-01-06 LAB — HEPATIC FUNCTION PANEL
ALT: 14 U/L (ref 0–44)
AST: 26 U/L (ref 15–41)
Albumin: 4.1 g/dL (ref 3.5–5.0)
Alkaline Phosphatase: 95 U/L (ref 38–126)
Bilirubin, Direct: 0.3 mg/dL — ABNORMAL HIGH (ref 0.0–0.2)
Indirect Bilirubin: 1 mg/dL — ABNORMAL HIGH (ref 0.3–0.9)
Total Bilirubin: 1.3 mg/dL — ABNORMAL HIGH (ref 0.0–1.2)
Total Protein: 7.1 g/dL (ref 6.5–8.1)

## 2024-01-06 LAB — CBC
HCT: 46 % (ref 39.0–52.0)
Hemoglobin: 15.4 g/dL (ref 13.0–17.0)
MCH: 28.9 pg (ref 26.0–34.0)
MCHC: 33.5 g/dL (ref 30.0–36.0)
MCV: 86.3 fL (ref 80.0–100.0)
Platelets: 329 10*3/uL (ref 150–400)
RBC: 5.33 MIL/uL (ref 4.22–5.81)
RDW: 14.1 % (ref 11.5–15.5)
WBC: 9 10*3/uL (ref 4.0–10.5)
nRBC: 0 % (ref 0.0–0.2)

## 2024-01-06 LAB — LIPASE, BLOOD: Lipase: 28 U/L (ref 11–51)

## 2024-01-06 LAB — TROPONIN I (HIGH SENSITIVITY): Troponin I (High Sensitivity): 10 ng/L (ref <18)

## 2024-01-06 MED ORDER — IOHEXOL 300 MG/ML  SOLN
100.0000 mL | Freq: Once | INTRAMUSCULAR | Status: AC | PRN
  Administered 2024-01-06: 100 mL via INTRAVENOUS

## 2024-01-06 MED ORDER — PREDNISONE 20 MG PO TABS: 40.0000 mg | ORAL_TABLET | Freq: Every day | ORAL | 0 refills | Status: AC

## 2024-01-06 MED ORDER — LIDOCAINE 5 % EX PTCH: 1.0000 | MEDICATED_PATCH | Freq: Two times a day (BID) | CUTANEOUS | 0 refills | Status: AC

## 2024-01-06 MED ORDER — HYDROMORPHONE HCL 1 MG/ML IJ SOLN: 0.5000 mg | Freq: Once | INTRAMUSCULAR | Status: DC

## 2024-01-06 MED ORDER — SODIUM CHLORIDE 0.9 % IV BOLUS: 1000.0000 mL | Freq: Once | INTRAVENOUS | Status: DC

## 2024-01-06 MED ORDER — CEFDINIR 300 MG PO CAPS
300.0000 mg | ORAL_CAPSULE | Freq: Two times a day (BID) | ORAL | 0 refills | Status: AC
Start: 2024-01-06 — End: 2024-01-11

## 2024-01-06 MED ORDER — ACETAMINOPHEN 500 MG PO TABS
1000.0000 mg | ORAL_TABLET | Freq: Once | ORAL | Status: DC
  Administered 2024-01-06: 1000 mg via ORAL
  Filled 2024-01-06: qty 2

## 2024-01-06 MED ORDER — ONDANSETRON HCL 4 MG/2ML IJ SOLN
4.0000 mg | Freq: Once | INTRAMUSCULAR | Status: AC
  Administered 2024-01-06: 4 mg via INTRAVENOUS
  Filled 2024-01-06: qty 2

## 2024-01-06 MED ORDER — DOXYCYCLINE HYCLATE 100 MG PO CAPS
100.0000 mg | ORAL_CAPSULE | Freq: Two times a day (BID) | ORAL | 0 refills | Status: AC
Start: 2024-01-06 — End: 2024-01-11

## 2024-01-06 MED ORDER — LIDOCAINE 5 % EX PTCH
1.0000 | MEDICATED_PATCH | CUTANEOUS | Status: DC
  Administered 2024-01-06: 1 via TRANSDERMAL
  Filled 2024-01-06: qty 1

## 2024-01-06 MED ORDER — CARVEDILOL 6.25 MG PO TABS
6.2500 mg | ORAL_TABLET | Freq: Once | ORAL | Status: DC
  Filled 2024-01-06: qty 1

## 2024-01-06 MED ORDER — OXYCODONE HCL 5 MG PO TABS
2.5000 mg | ORAL_TABLET | Freq: Once | ORAL | Status: AC
  Administered 2024-01-06: 2.5 mg via ORAL
  Filled 2024-01-06: qty 1

## 2024-01-06 MED ORDER — FENTANYL CITRATE PF 50 MCG/ML IJ SOSY
50.0000 ug | PREFILLED_SYRINGE | Freq: Once | INTRAMUSCULAR | Status: AC
  Administered 2024-01-06: 50 ug via INTRAVENOUS
  Filled 2024-01-06: qty 1

## 2024-01-06 NOTE — ED Notes (Signed)
 Pt request lunch tray and it is provided

## 2024-01-06 NOTE — ED Notes (Signed)
 Pt provided urinal and assisted to standing pt uses urinal and is assisted to Nurse, children's places monitor on pt pt states "And you will take this off as soon as it's done!" Pt noted to be aggressive toward Presenter, broadcasting who exits the room

## 2024-01-06 NOTE — ED Triage Notes (Signed)
 Patient states right flank pain since this morning; denies urinary symptoms, N/V/D.

## 2024-01-06 NOTE — ED Notes (Signed)
 Pt noted to be verbally abusive toward Presenter, broadcasting

## 2024-01-06 NOTE — ED Notes (Signed)
 Writer RN chaperone for Dr Fuller Plan rectal examine

## 2024-01-06 NOTE — ED Notes (Signed)
 Pt verbally abusive becomes aggressive with Presenter, broadcasting while Presenter, broadcasting attempts to draw blood with butterfly straight stick which is initially successful and pt yanks arm away ED physician notified pt continues to eat provided lunch tray

## 2024-01-06 NOTE — Discharge Instructions (Addendum)
 We are treating you for possible pneumonia with some antibiotics for a few days being to follow-up with your primary care doctor to have repeat x-ray done to ensure no developing pneumonia.  Your back pain I suspect is more likely musculoskeletal or a herniated disc.  If develop worsening weakness, numbness, issues with urinating or defecating on yourself please return to the ER.  Otherwise you can call the orthopedic doctors to make a follow-up appointment   Peribronchovascular ground-glass airspace opacities of the partially visualized right lower lobe with limited evaluation due to motion artifact. Recommend CT chest PA and lateral view for furthe  evaluation. 2. Cholelithiasis with no CT evidence of acute cholecystitis. 3. Hepatic steatosis. 4. Tiny hiatal hernia. 5. Aortic Atherosclerosis (ICD10-I70.0) including coronary artery and aortic valve leaflet calcifications-correlate for aortic stenosis.  IMPRESSION: Vague right lower lobe airspace opacity suggestive of developing infection/inflammation. Followup PA and lateral chest X-ray is recommended in 3-4 weeks following therapy to ensure resolution and exclude underlying malignancy.

## 2024-01-06 NOTE — ED Provider Notes (Signed)
 [facility] Provider Note    Event Date/Time   First MD Initiated Contact with Patient 01/06/24 1600     (approximate)   History   Flank Pain   HPI  Allen Terry is a 88 y.o. male who comes in with right flank pain.  Patient has a history of A-fib not on anticoagulation due to recurrent GI bleeds who comes in with concerns for right flank pain.  Patient reports right flank pain over the past day.  Denies any associated symptoms with it.  He reports chronic shortness of breath but denies anything being new today.  Physical Exam   Triage Vital Signs: ED Triage Vitals  Encounter Vitals Group     BP 01/06/24 1518 (!) 90/46     Systolic BP Percentile --      Diastolic BP Percentile --      Pulse Rate 01/06/24 1518 (!) 54     Resp 01/06/24 1518 18     Temp 01/06/24 1518 98.1 F (36.7 C)     Temp Source 01/06/24 1518 Oral     SpO2 01/06/24 1518 95 %     Weight --      Height --      Head Circumference --      Peak Flow --      Pain Score 01/06/24 1519 10     Pain Loc --      Pain Education --      Exclude from Growth Chart --     Most recent vital signs: Vitals:   01/06/24 1518  BP: (!) 90/46  Pulse: (!) 54  Resp: 18  Temp: 98.1 F (36.7 C)  SpO2: 95%     General: Awake, no distress.  CV:  Good peripheral perfusion.  Resp:  Normal effort.  Abd:  No distention.  Low right back pain. Other:  No rash noted   ED Results / Procedures / Treatments   Labs (all labs ordered are listed, but only abnormal results are displayed) Labs Reviewed  BASIC METABOLIC PANEL - Abnormal; Notable for the following components:      Result Value   Glucose, Bld 121 (*)    BUN 6 (*)    All other components within normal limits  CBC  URINALYSIS, ROUTINE W REFLEX MICROSCOPIC     EKG  My interpretation of EKG:  Atrial fibrillation with a rate of 98 without any ST elevation or T wave inversions, normal intervals  RADIOLOGY I have reviewed the ct  personally and interpreted and no AAA rupture    PROCEDURES:  Critical Care performed: No  .1-3 Lead EKG Interpretation  Performed by: Concha Se, MD Authorized by: Concha Se, MD     Interpretation: abnormal     ECG rate:  80   ECG rate assessment: normal     Rhythm: atrial fibrillation     Ectopy: none     Conduction: normal      MEDICATIONS ORDERED IN ED: Medications  sodium chloride 0.9 % bolus 1,000 mL (has no administration in time range)  ondansetron (ZOFRAN) injection 4 mg (has no administration in time range)  fentaNYL (SUBLIMAZE) injection 50 mcg (has no administration in time range)     IMPRESSION / MDM / ASSESSMENT AND PLAN / ED COURSE  I reviewed the triage vital signs and the nursing notes.   Patient's presentation is most consistent with acute presentation with potential threat to life or bodily function.  Differential includes aneurysm, kidney stone, perforation will get CT imaging.  Will get some IV fentanyl IV Zofran help with pain  CBC reassuring BMP shows stable creatinine  1. Peribronchovascular ground-glass airspace opacities of the partially visualized right lower lobe with limited evaluation due to motion artifact. Recommend CT chest PA and lateral view for further evaluation. 2. Cholelithiasis with no CT evidence of acute cholecystitis. 3. Hepatic steatosis. 4. Tiny hiatal hernia. 5. Aortic Atherosclerosis (ICD10-I70.0) including coronary artery and aortic valve leaflet calcifications-correlate for aortic stenosis.    7:39 PM patient does report that he was doing a lot of weed eating prior to this.  He does now report that the pain was radiating down the back of his legs I suspect this is more likely herniated disc.  He has no weakness in the leg he still able to lift it up his sensation is intact.  No bladder or bowel incontinence and CT imaging does not show any evidence of urinary retention.  I did do a rectal exam with good tone,  no numbness, no tingling.  Therefore at this time no evidence of cord compression.  We discussed symptomatic treatment with steroids, Tylenol, lidocaine patches, follow-up with orthopedics.  Patient cannot take ibuprofen secondary to issues with GI bleeding in the past.  Is got no blood on his rectal exam today.  He also reports not been able to take Tylenol due to issues with it causing him to be hyperactive.  We discussed steroids, lidocaine patches and following up outpatient with orthopedics.  Patient declining repeat troponin that he pulled his arm away from the nurse and has been verbally aggressive towards her.  I discussed with patient he declines additional blood work he denies any chest pain, shortness of breath patient provide incidental findings.  I did discuss patient's x-ray  IMPRESSION: Vague right lower lobe airspace opacity suggestive of developing infection/inflammation. Followup PA and lateral chest X-ray is recommended in 3-4 weeks following therapy to ensure resolution and exclude underlying malignancy.  Patient denies any smoking history.  He does report having some allergy-like symptoms so we will cover for possible pneumonia and he can follow-up with his primary care doctor for repeat x-ray to ensure there is no underlying cancer.  He denies any smoking history and he felt comfortable with this plan.  At this time patient feels comfortable with discharge home  The patient is on the cardiac monitor to evaluate for evidence of arrhythmia and/or significant heart rate changes.      FINAL CLINICAL IMPRESSION(S) / ED DIAGNOSES   Final diagnoses:  Flank pain  Pneumonia of right lung due to infectious organism, unspecified part of lung     Rx / DC Orders   ED Discharge Orders     None        Note:  This document was prepared using Dragon voice recognition software and may include unintentional dictation errors.   Concha Se, MD 01/06/24 2111
